# Patient Record
Sex: Female | Born: 1955 | Race: White | Hispanic: No | Marital: Married | Smoking: Never smoker
Health system: Southern US, Community
[De-identification: ages and names within clinical notes are randomized; demographics above are authoritative.]

## PROBLEM LIST (undated history)

## (undated) DIAGNOSIS — IMO0002 Reserved for concepts with insufficient information to code with codable children: Secondary | ICD-10-CM

## (undated) DIAGNOSIS — I10 Essential (primary) hypertension: Secondary | ICD-10-CM

## (undated) DIAGNOSIS — M069 Rheumatoid arthritis, unspecified: Secondary | ICD-10-CM

## (undated) DIAGNOSIS — E785 Hyperlipidemia, unspecified: Secondary | ICD-10-CM

## (undated) HISTORY — DX: Hyperlipidemia, unspecified: E78.5

## (undated) HISTORY — PX: DILATION AND CURETTAGE OF UTERUS: SHX78

## (undated) HISTORY — DX: Reserved for concepts with insufficient information to code with codable children: IMO0002

---

## 1996-12-29 HISTORY — PX: APPENDECTOMY: SHX54

## 1997-12-29 HISTORY — PX: ABDOMINAL HYSTERECTOMY: SHX81

## 2010-11-20 ENCOUNTER — Encounter: Admission: RE | Admit: 2010-11-20 | Discharge: 2010-11-20 | Payer: Self-pay

## 2011-10-21 ENCOUNTER — Other Ambulatory Visit: Payer: Self-pay | Admitting: Internal Medicine

## 2011-10-21 DIAGNOSIS — Z1231 Encounter for screening mammogram for malignant neoplasm of breast: Secondary | ICD-10-CM

## 2011-11-26 ENCOUNTER — Ambulatory Visit
Admission: RE | Admit: 2011-11-26 | Discharge: 2011-11-26 | Disposition: A | Discharge status: Home / Self Care | Payer: BC Managed Care – PPO | Source: Ambulatory Visit | Attending: Internal Medicine | Admitting: Internal Medicine

## 2011-11-26 DIAGNOSIS — Z1231 Encounter for screening mammogram for malignant neoplasm of breast: Secondary | ICD-10-CM

## 2012-01-14 LAB — HM DEXA SCAN

## 2012-05-21 ENCOUNTER — Ambulatory Visit (INDEPENDENT_AMBULATORY_CARE_PROVIDER_SITE_OTHER): Payer: BC Managed Care – PPO | Admitting: General Surgery

## 2012-05-27 ENCOUNTER — Encounter (HOSPITAL_BASED_OUTPATIENT_CLINIC_OR_DEPARTMENT_OTHER): Payer: Self-pay

## 2012-05-27 ENCOUNTER — Emergency Department (HOSPITAL_BASED_OUTPATIENT_CLINIC_OR_DEPARTMENT_OTHER): Payer: BC Managed Care – PPO

## 2012-05-27 ENCOUNTER — Observation Stay (HOSPITAL_BASED_OUTPATIENT_CLINIC_OR_DEPARTMENT_OTHER)
Admission: EM | Admit: 2012-05-27 | Discharge: 2012-05-29 | Disposition: A | Discharge status: Home / Self Care | Payer: BC Managed Care – PPO | Attending: Internal Medicine | Admitting: Internal Medicine

## 2012-05-27 DIAGNOSIS — M064 Inflammatory polyarthropathy: Secondary | ICD-10-CM | POA: Insufficient documentation

## 2012-05-27 DIAGNOSIS — R079 Chest pain, unspecified: Principal | ICD-10-CM | POA: Insufficient documentation

## 2012-05-27 DIAGNOSIS — D72820 Lymphocytosis (symptomatic): Secondary | ICD-10-CM | POA: Insufficient documentation

## 2012-05-27 DIAGNOSIS — I714 Abdominal aortic aneurysm, without rupture, unspecified: Secondary | ICD-10-CM | POA: Insufficient documentation

## 2012-05-27 DIAGNOSIS — I10 Essential (primary) hypertension: Secondary | ICD-10-CM | POA: Insufficient documentation

## 2012-05-27 DIAGNOSIS — L932 Other local lupus erythematosus: Secondary | ICD-10-CM | POA: Diagnosis present

## 2012-05-27 DIAGNOSIS — L930 Discoid lupus erythematosus: Secondary | ICD-10-CM | POA: Insufficient documentation

## 2012-05-27 DIAGNOSIS — D709 Neutropenia, unspecified: Secondary | ICD-10-CM | POA: Insufficient documentation

## 2012-05-27 HISTORY — DX: Rheumatoid arthritis, unspecified: M06.9

## 2012-05-27 HISTORY — DX: Essential (primary) hypertension: I10

## 2012-05-27 LAB — DIFFERENTIAL
Basophils Relative: 1 % (ref 0–1)
Lymphs Abs: 5.4 10*3/uL — ABNORMAL HIGH (ref 0.7–4.0)
Monocytes Absolute: 1.5 10*3/uL — ABNORMAL HIGH (ref 0.1–1.0)
Monocytes Relative: 14 % — ABNORMAL HIGH (ref 3–12)
Neutro Abs: 3 10*3/uL (ref 1.7–7.7)

## 2012-05-27 LAB — CARDIAC PANEL(CRET KIN+CKTOT+MB+TROPI)
CK, MB: 2.8 ng/mL (ref 0.3–4.0)
Total CK: 187 U/L — ABNORMAL HIGH (ref 7–177)

## 2012-05-27 LAB — COMPREHENSIVE METABOLIC PANEL
Albumin: 4 g/dL (ref 3.5–5.2)
Alkaline Phosphatase: 45 U/L (ref 39–117)
BUN: 12 mg/dL (ref 6–23)
Chloride: 104 mEq/L (ref 96–112)
Creatinine, Ser: 0.7 mg/dL (ref 0.50–1.10)
GFR calc Af Amer: >90 mL/min (ref >90)
GFR calc non Af Amer: >90 mL/min (ref >90)
Glucose, Bld: 107 mg/dL — ABNORMAL HIGH (ref 70–99)
Potassium: 3.4 mEq/L — ABNORMAL LOW (ref 3.5–5.1)
Total Bilirubin: 0.4 mg/dL (ref 0.3–1.2)

## 2012-05-27 LAB — CBC
HCT: 41.5 % (ref 36.0–46.0)
Hemoglobin: 14.3 g/dL (ref 12.0–15.0)
MCH: 31 pg (ref 26.0–34.0)
MCHC: 34.5 g/dL (ref 30.0–36.0)
RBC: 4.62 MIL/uL (ref 3.87–5.11)

## 2012-05-27 MED ORDER — ONDANSETRON HCL 4 MG/2ML IJ SOLN
4.0000 mg | Freq: Once | INTRAMUSCULAR | Status: AC
  Administered 2012-05-27: 4 mg via INTRAVENOUS
  Filled 2012-05-27: qty 2

## 2012-05-27 MED ORDER — IOHEXOL 350 MG/ML SOLN
80.0000 mL | Freq: Once | INTRAVENOUS | Status: AC | PRN
Start: 2012-05-27 — End: 2012-05-27
  Administered 2012-05-27: 80 mL via INTRAVENOUS

## 2012-05-27 MED ORDER — MORPHINE SULFATE 4 MG/ML IJ SOLN
4.0000 mg | Freq: Once | INTRAMUSCULAR | Status: AC
  Administered 2012-05-27: 4 mg via INTRAVENOUS
  Filled 2012-05-27: qty 1

## 2012-05-27 MED ORDER — ASPIRIN 81 MG PO CHEW
324.0000 mg | CHEWABLE_TABLET | Freq: Once | ORAL | Status: AC
  Administered 2012-05-27: 324 mg via ORAL
  Filled 2012-05-27: qty 4

## 2012-05-27 MED ORDER — NITROGLYCERIN 0.4 MG SL SUBL
0.4000 mg | SUBLINGUAL_TABLET | SUBLINGUAL | Status: AC | PRN
  Administered 2012-05-27 (×3): 0.4 mg via SUBLINGUAL
  Filled 2012-05-27: qty 25

## 2012-05-27 NOTE — ED Notes (Signed)
Pt reports some relief with nitro sl, vs monitored, denies sob

## 2012-05-27 NOTE — ED Provider Notes (Addendum)
History     CSN: [id number]  Arrival date & time 05/27/12  2004   First MD Initiated Contact with Patient 05/27/12 2027      Chief Complaint  Patient presents with  . Chest Pain    (Consider location/radiation/quality/duration/timing/severity/associated sxs/prior treatment) Patient is a 56 y.o. female presenting with chest pain. The history is provided by the patient.  Chest Pain The chest pain began 6 - 12 hours ago. Duration of episode(s) is 6 hours. Chest pain occurs constantly. The chest pain is unchanged. Associated with: started while sitting. At its most intense, the pain is at 9/10. The pain is currently at 9/10. The severity of the pain is severe. The quality of the pain is described as burning, pressure-like, sharp and stabbing. Radiates to: in the substernal area and goes into her right chest. Chest pain is worsened by deep breathing. Primary symptoms include cough and wheezing. Pertinent negatives for primary symptoms include no fever, no fatigue, no shortness of breath, no abdominal pain, no nausea, no vomiting and no dizziness.  Pertinent negatives for associated symptoms include no diaphoresis and no lower extremity edema. She tried nothing for the symptoms.  Her past medical history is significant for hypertension.  Pertinent negatives for past medical history include no aneurysm, no CAD, no diabetes and no MI.  Her family medical history is significant for CAD in family and early MI in family.  Procedure history is negative for cardiac catheterization and echocardiogram.     Past Medical History  Diagnosis Date  . Rheumatoid arthritis   . Hypertension     Past Surgical History  Procedure Date  . Appendectomy   . Cesarean section   . Abdominal hysterectomy     No family history on file.  History  Substance Use Topics  . Smoking status: Never Smoker   . Smokeless tobacco: Not on file  . Alcohol Use: Yes    OB History    Grav Para Term Preterm Abortions  TAB SAB Ect Mult Living                  Review of Systems  Constitutional: Negative for fever, diaphoresis and fatigue.  Respiratory: Positive for cough and wheezing. Negative for shortness of breath.   Cardiovascular: Positive for chest pain.  Gastrointestinal: Negative for nausea, vomiting and abdominal pain.  Neurological: Negative for dizziness.  All other systems reviewed and are negative.    Allergies  Review of patient's allergies indicates not on file.  Home Medications  No current outpatient prescriptions on file.  BP 207/92  Pulse 86  Temp(Src) 97.5 F (36.4 C) (Oral)  Resp 20  SpO2 98%  Physical Exam  Nursing note and vitals reviewed. Constitutional: She is oriented to person, place, and time. She appears well-developed and well-nourished. She appears distressed.       Uncomfortable in the bed and squirming around  HENT:  Head: Normocephalic and atraumatic.  Mouth/Throat: Oropharynx is clear and moist.  Eyes: Conjunctivae and EOM are normal. Pupils are equal, round, and reactive to light.  Neck: Normal range of motion. Neck supple.  Cardiovascular: Normal rate, regular rhythm and intact distal pulses.   No murmur heard. Pulmonary/Chest: Effort normal. No respiratory distress. She has wheezes. She has rhonchi in the right lower field. She has no rales. She exhibits no tenderness.  Abdominal: Soft. She exhibits no distension. There is no tenderness. There is no rebound and no guarding.  Musculoskeletal: Normal range of motion. She exhibits no  edema and no tenderness.  Neurological: She is alert and oriented to person, place, and time.  Skin: Skin is warm and dry. No rash noted. No erythema.  Psychiatric: She has a normal mood and affect. Her behavior is normal.    ED Course  Procedures (including critical care time)  Labs Reviewed  CBC - Abnormal; Notable for the following:    WBC 10.7 (*)    All other components within normal limits  DIFFERENTIAL -  Abnormal; Notable for the following:    Neutrophils Relative 28 (*)    Lymphocytes Relative 51 (*)    Lymphs Abs 5.4 (*)    Monocytes Relative 14 (*)    Monocytes Absolute 1.5 (*)    Eosinophils Relative 7 (*)    All other components within normal limits  COMPREHENSIVE METABOLIC PANEL - Abnormal; Notable for the following:    Potassium 3.4 (*)    Glucose, Bld 107 (*)    All other components within normal limits  CARDIAC PANEL(CRET KIN+CKTOT+MB+TROPI) - Abnormal; Notable for the following:    Total CK 187 (*)    All other components within normal limits  D-DIMER, QUANTITATIVE - Abnormal; Notable for the following:    D-Dimer, Quant 1.02 (*)    All other components within normal limits   Dg Chest 2 View  05/27/2012  *RADIOLOGY REPORT*  Clinical Data: Right chest pain.  CHEST - 2 VIEW  Comparison: None.  Findings: Borderline enlarged cardiac silhouette.  Minimal diffuse peribronchial thickening.  Unremarkable bones.  IMPRESSION:  1.  Borderline cardiomegaly. 2.  Minimal bronchitic changes.  Original Report Authenticated By: Darrol Angel, M.D.   Ct Angio Chest W/cm &/or Wo Cm  05/27/2012  *RADIOLOGY REPORT*  Clinical Data: Chest pain.  Hypertension.  Elevated D-dimer.  CT ANGIOGRAPHY CHEST  Technique:  Multidetector CT imaging of the chest using the standard protocol during bolus administration of intravenous contrast. Multiplanar reconstructed images including MIPs were obtained and reviewed to evaluate the vascular anatomy.  Contrast: 80mL OMNIPAQUE IOHEXOL 350 MG/ML SOLN  Comparison: None.  Findings: Technically adequate study with good opacification of the central and segmental pulmonary arteries as well as the thoracic aorta.  No focal filling defects are demonstrated in the pulmonary arteries.  No evidence of significant pulmonary embolus.  The thoracic aorta is patent without evidence of aneurysm or dissection.  Mild calcification.  Normal heart size.  No significant lymphadenopathy in  the chest. The esophagus is decompressed.  No focal airspace consolidation in the lungs.  Mild dependent atelectasis bilaterally.  No pleural effusion.  No pneumothorax. Airways appear patent.  Degenerative changes in the thoracic spine with normal alignment. Small sebaceous cyst in the posterior subcutaneous fat.  IMPRESSION: No evidence of significant pulmonary embolus.  No evidence of aortic aneurysm or dissection.  Original Report Authenticated By: Marlon Pel, M.D.   US Abdomen Complete  05/27/2012  *RADIOLOGY REPORT*  Clinical Data:  Chest pain  ABDOMINAL ULTRASOUND COMPLETE  Comparison:  CT of the chest earlier today includes part of the upper abdomen.  Findings:  Gallbladder:  No gallstones, gallbladder wall thickening, or pericholecystic fluid.  Common Bile Duct:  Within normal limits in caliber.  Liver: No focal mass lesion identified.  Within normal limits in parenchymal echogenicity.  IVC:  Limited by bowel gas.  Pancreas:  Limited evaluation by bowel gas.  No gross abnormality.  Spleen:  Within normal limits in size and echotexture.  Right kidney:  Normal in size and parenchymal echogenicity.  No evidence of mass or hydronephrosis.  Left kidney:  Normal in size and parenchymal echogenicity.  No evidence of mass or hydronephrosis.  Abdominal Aorta:  Limited by bowel gas.  One measurement suggests enlargement of to 3.1 cm.  IMPRESSION: No acute abdominal pathology.  Possible abdominal aortic aneurysm of 3.1 cm, although complete characterization is limited by overlying bowel gas.  Original Report Authenticated By: Elsie Stain, M.D.     Date: 05/27/2012  Rate: 73  Rhythm: normal sinus rhythm  QRS Axis: normal  Intervals: normal  ST/T Wave abnormalities: nonspecific ST changes ST flattening in the inferior leads  Conduction Disutrbances:none  Narrative Interpretation:   Old EKG Reviewed: none available   1. Chest pain       MDM   Patient with chest pain that's been ongoing  since 2 PM. On exam patient is uncomfortable appearing squirming around in the bed but has normal vital signs other than hypertension and denies any shortness of breath, diaphoresis, nausea or vomiting. This did not occur after eating she states she has had some issues with bronchitis that's been ongoing for the last several months after she started Remicade for her rheumatoid arthritis. She states that she finished antibiotics 2 weeks ago but has noticed an more coughing recently. On exam she has wheezing in the right lung base but no abdominal pain or palpable chest pain. She is a TIMI 0 but does have a strong family history of coronary artery disease. Concern for possible infectious etiology versus coronary artery disease versus PE. Dissection be unlikely as patient is having no abdominal pain the pain does not radiate. It all seems to be on the right side of the chest. Patient given aspirin and nitroglycerin to see if that improved her pain lower suspicion for gallbladder etiology at this time. EKG with some mild ST flattening in the inferior leads but otherwise within normal limits.  11:19 PM Labs are all within normal limits other than an elevated d-dimer. After nitroglycerin and aspirin patient's pain is not improved at all. It did improve mildly with morphine she is still uncomfortable. CT of the chest was negative for dissection or pulmonary embolism. Feel that there is still possibility that this is gallbladder and an ultrasound was ordered.  11:46 PM Ultrasound shows no sign of gallbladder pathology and the possible 3 cm aortic aneurysm however due to a large amount of bowel gas and able to fully evaluate   Patient still uncomfortable and having substernal chest pain radiating to the right hypertension cannot feel comfortable discharging the patient home as the etiology of this chest pain is still unknown. Will speak with the hospitalist about further evaluation and admission.   Gwyneth Sprout, MD 05/27/12 1610  Gwyneth Sprout, MD 05/28/12 [id number]

## 2012-05-27 NOTE — ED Notes (Signed)
Pt recently treated for bronchitis 2 weeks ago, had severe sob with bronchtis, started on remicade for RA in januarary,, pt started on pepcid several months for indigestion, pain in chest  Started this am and has not decreased in intensity

## 2012-05-27 NOTE — ED Notes (Signed)
C/o right side ,central CP since 2pm

## 2012-05-28 ENCOUNTER — Encounter (HOSPITAL_COMMUNITY): Payer: Self-pay | Admitting: General Practice

## 2012-05-28 DIAGNOSIS — I10 Essential (primary) hypertension: Secondary | ICD-10-CM

## 2012-05-28 DIAGNOSIS — D72820 Lymphocytosis (symptomatic): Secondary | ICD-10-CM | POA: Diagnosis present

## 2012-05-28 DIAGNOSIS — D702 Other drug-induced agranulocytosis: Secondary | ICD-10-CM

## 2012-05-28 DIAGNOSIS — T50905A Adverse effect of unspecified drugs, medicaments and biological substances, initial encounter: Secondary | ICD-10-CM | POA: Diagnosis present

## 2012-05-28 DIAGNOSIS — L932 Other local lupus erythematosus: Secondary | ICD-10-CM | POA: Diagnosis present

## 2012-05-28 DIAGNOSIS — J90 Pleural effusion, not elsewhere classified: Secondary | ICD-10-CM

## 2012-05-28 DIAGNOSIS — D709 Neutropenia, unspecified: Secondary | ICD-10-CM | POA: Diagnosis present

## 2012-05-28 DIAGNOSIS — R079 Chest pain, unspecified: Secondary | ICD-10-CM

## 2012-05-28 LAB — URINALYSIS, ROUTINE W REFLEX MICROSCOPIC
Bilirubin Urine: NEGATIVE
Ketones, ur: 15 mg/dL — AB
Nitrite: NEGATIVE
Specific Gravity, Urine: >1.046 — ABNORMAL HIGH (ref 1.005–1.030)
Urobilinogen, UA: 0.2 mg/dL (ref 0.0–1.0)

## 2012-05-28 LAB — CARDIAC PANEL(CRET KIN+CKTOT+MB+TROPI)
Relative Index: 1.8 (ref 0.0–2.5)
Relative Index: 2.2 (ref 0.0–2.5)
Relative Index: 2.4 (ref 0.0–2.5)
Total CK: 146 U/L (ref 7–177)
Total CK: 116 U/L (ref 7–177)
Total CK: 102 U/L (ref 7–177)
Troponin I: <0.3 ng/mL (ref <0.30)

## 2012-05-28 LAB — BASIC METABOLIC PANEL
BUN: 11 mg/dL (ref 6–23)
CO2: 26 mEq/L (ref 19–32)
Calcium: 9.4 mg/dL (ref 8.4–10.5)
Chloride: 105 mEq/L (ref 96–112)
Creatinine, Ser: 0.59 mg/dL (ref 0.50–1.10)

## 2012-05-28 LAB — CBC
HCT: 40.2 % (ref 36.0–46.0)
Hemoglobin: 13.7 g/dL (ref 12.0–15.0)
RBC: 4.38 MIL/uL (ref 3.87–5.11)
WBC: 10.3 10*3/uL (ref 4.0–10.5)

## 2012-05-28 LAB — DIFFERENTIAL
Basophils Relative: 1 % (ref 0–1)
Lymphocytes Relative: 38 % (ref 12–46)
Lymphs Abs: 3.9 10*3/uL (ref 0.7–4.0)
Monocytes Absolute: 1.4 10*3/uL — ABNORMAL HIGH (ref 0.1–1.0)
Monocytes Relative: 14 % — ABNORMAL HIGH (ref 3–12)
Neutro Abs: 4.2 10*3/uL (ref 1.7–7.7)
Neutrophils Relative %: 41 % — ABNORMAL LOW (ref 43–77)

## 2012-05-28 MED ORDER — LORAZEPAM 2 MG/ML IJ SOLN: 1.0000 mg | Freq: Four times a day (QID) | INTRAMUSCULAR | Status: DC | PRN

## 2012-05-28 MED ORDER — HYDROMORPHONE HCL PF 1 MG/ML IJ SOLN
1.0000 mg | INTRAMUSCULAR | Status: DC | PRN
  Administered 2012-05-28: 1 mg via INTRAVENOUS

## 2012-05-28 MED ORDER — ONDANSETRON HCL 4 MG/2ML IJ SOLN
4.0000 mg | Freq: Three times a day (TID) | INTRAMUSCULAR | Status: DC | PRN
  Filled 2012-05-28: qty 2

## 2012-05-28 MED ORDER — PROMETHAZINE HCL 12.5 MG PO TABS
12.5000 mg | ORAL_TABLET | Freq: Four times a day (QID) | ORAL | Status: DC | PRN
  Administered 2012-05-28: 12.5 mg via ORAL
  Filled 2012-05-28: qty 1

## 2012-05-28 MED ORDER — PREDNISONE (PAK) 5 MG PO TABS: 5.0000 mg | ORAL_TABLET | Freq: Every day | ORAL | Status: DC

## 2012-05-28 MED ORDER — PREDNISONE 5 MG PO TABS
5.0000 mg | ORAL_TABLET | Freq: Every day | ORAL | Status: DC
  Administered 2012-05-28: 5 mg via ORAL
  Filled 2012-05-28 (×4): qty 1

## 2012-05-28 MED ORDER — HYDROCODONE-ACETAMINOPHEN 5-325 MG PO TABS
1.0000 | ORAL_TABLET | ORAL | Status: DC | PRN
  Administered 2012-05-28 – 2012-05-29 (×3): 1 via ORAL
  Filled 2012-05-28 (×4): qty 1

## 2012-05-28 MED ORDER — SODIUM CHLORIDE 0.9 % IV SOLN: 250.0000 mL | INTRAVENOUS | Status: DC | PRN

## 2012-05-28 MED ORDER — SENNA 8.6 MG PO TABS
1.0000 | ORAL_TABLET | Freq: Two times a day (BID) | ORAL | Status: DC
  Administered 2012-05-29: 8.6 mg via ORAL
  Filled 2012-05-28 (×4): qty 1

## 2012-05-28 MED ORDER — SODIUM CHLORIDE 0.9 % IJ SOLN: 3.0000 mL | INTRAMUSCULAR | Status: DC | PRN

## 2012-05-28 MED ORDER — SODIUM CHLORIDE 0.9 % IJ SOLN
3.0000 mL | Freq: Two times a day (BID) | INTRAMUSCULAR | Status: DC
  Administered 2012-05-28 (×2): 3 mL via INTRAVENOUS

## 2012-05-28 MED ORDER — ENOXAPARIN SODIUM 40 MG/0.4ML ~~LOC~~ SOLN
40.0000 mg | SUBCUTANEOUS | Status: DC
  Administered 2012-05-28 – 2012-05-29 (×2): 40 mg via SUBCUTANEOUS
  Filled 2012-05-28 (×2): qty 0.4

## 2012-05-28 MED ORDER — SODIUM CHLORIDE 0.9 % IV SOLN: INTRAVENOUS | Status: DC

## 2012-05-28 MED ORDER — LISINOPRIL 20 MG PO TABS
20.0000 mg | ORAL_TABLET | Freq: Every day | ORAL | Status: DC
  Administered 2012-05-28 – 2012-05-29 (×2): 20 mg via ORAL
  Filled 2012-05-28 (×2): qty 1

## 2012-05-28 MED ORDER — LISINOPRIL-HYDROCHLOROTHIAZIDE 20-12.5 MG PO TABS: 1.0000 | ORAL_TABLET | Freq: Every day | ORAL | Status: DC

## 2012-05-28 MED ORDER — ACETAMINOPHEN 325 MG PO TABS
650.0000 mg | ORAL_TABLET | Freq: Four times a day (QID) | ORAL | Status: DC | PRN
  Administered 2012-05-28: 650 mg via ORAL
  Filled 2012-05-28: qty 2

## 2012-05-28 MED ORDER — HYDROMORPHONE HCL PF 1 MG/ML IJ SOLN
1.0000 mg | Freq: Once | INTRAMUSCULAR | Status: AC
  Administered 2012-05-28: 1 mg via INTRAVENOUS
  Filled 2012-05-28: qty 1

## 2012-05-28 MED ORDER — PROMETHAZINE HCL 25 MG/ML IJ SOLN: 25.0000 mg | Freq: Four times a day (QID) | INTRAMUSCULAR | Status: DC | PRN

## 2012-05-28 MED ORDER — PROMETHAZINE HCL 25 MG/ML IJ SOLN
12.5000 mg | Freq: Four times a day (QID) | INTRAMUSCULAR | Status: DC | PRN
  Administered 2012-05-28: 12.5 mg via INTRAVENOUS
  Filled 2012-05-28: qty 1

## 2012-05-28 MED ORDER — POLYETHYLENE GLYCOL 3350 17 G PO PACK
17.0000 g | PACK | Freq: Every day | ORAL | Status: DC | PRN
  Filled 2012-05-28: qty 1

## 2012-05-28 MED ORDER — ONDANSETRON HCL 4 MG/2ML IJ SOLN
4.0000 mg | INTRAMUSCULAR | Status: DC | PRN
  Administered 2012-05-28 (×3): 4 mg via INTRAVENOUS
  Filled 2012-05-28 (×2): qty 2

## 2012-05-28 MED ORDER — LEFLUNOMIDE 20 MG PO TABS
20.0000 mg | ORAL_TABLET | Freq: Every day | ORAL | Status: DC
  Administered 2012-05-28 – 2012-05-29 (×2): 20 mg via ORAL
  Filled 2012-05-28 (×2): qty 1

## 2012-05-28 MED ORDER — IBUPROFEN 800 MG PO TABS
800.0000 mg | ORAL_TABLET | Freq: Three times a day (TID) | ORAL | Status: DC
  Administered 2012-05-28 – 2012-05-29 (×2): 800 mg via ORAL
  Filled 2012-05-28 (×6): qty 1

## 2012-05-28 MED ORDER — HYDROCHLOROTHIAZIDE 12.5 MG PO CAPS
12.5000 mg | ORAL_CAPSULE | Freq: Every day | ORAL | Status: DC
  Administered 2012-05-28 – 2012-05-29 (×2): 12.5 mg via ORAL
  Filled 2012-05-28 (×2): qty 1

## 2012-05-28 MED ORDER — DOCUSATE SODIUM 100 MG PO CAPS
100.0000 mg | ORAL_CAPSULE | Freq: Two times a day (BID) | ORAL | Status: DC
  Administered 2012-05-29: 100 mg via ORAL
  Filled 2012-05-28 (×4): qty 1

## 2012-05-28 MED ORDER — ACETAMINOPHEN 650 MG RE SUPP: 650.0000 mg | Freq: Four times a day (QID) | RECTAL | Status: DC | PRN

## 2012-05-28 MED ORDER — HYDROMORPHONE HCL PF 1 MG/ML IJ SOLN
1.0000 mg | INTRAMUSCULAR | Status: DC | PRN
  Administered 2012-05-28: 1 mg via INTRAVENOUS
  Filled 2012-05-28 (×2): qty 1

## 2012-05-28 MED ORDER — ASPIRIN EC 325 MG PO TBEC
325.0000 mg | DELAYED_RELEASE_TABLET | Freq: Every day | ORAL | Status: DC
  Administered 2012-05-28 – 2012-05-29 (×2): 325 mg via ORAL
  Filled 2012-05-28 (×2): qty 1

## 2012-05-28 NOTE — H&P (Signed)
PCP:  Thayer Headings, MD, MD   Confirmed Dr. Dierdre Forth, rheum  Chief Complaint:  Chest pain  HPI: 55yoF with h/o RA x30 yrs and HTN who presents with right sided chest pain. Transfer from  [facility].   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 She states yesterday around 2p she developed right sided, sharp, stabbing, squeezing  chest pain, radiating around deep into her right chest, with no associated dizziness,  lightheadedness, nausea, possibly minimal clamminess. It is not worse with exertion or  relieved with rest. She has been trying to move around to get comfortable but cannot. It  is possibly a bit worse when she breathes in very deeply. She went home early, symtpoms  didn't get better, so husband took to ED at [facility].   Vitals so far stable except HTN up to 207/92. Labs with normal chem, LFT's. WBC 10.7 with  lymphocytosis. Cardiac enzymes negative x1. Ddimer minimally eleavated 1.02. CXR with  borderline cardiomegaly and minimal bronchitic changes. CTA chest done showed nothing. No  ECG is apparently in EPIC or in the notes sent, but per notes show some ST flattening  inferior leads otherwise normal. There was concern for GB etiology so abdominal u/s done,  showed nothing acute but ? possible AAA of 3.1 cm, but not completely characterized. She  was still uncomfortable, and no clear etiology found, so admission/transfer requested. Pt  had been given 324 ASA, 1mg  dilaudid, 4mg  morphine x2, SL NTG x3, zofran x1.   On interview, they state that things have gone badly since she started Remicade in  01/2012. Not long after, she got fevers (last one a month ago), cough, and was diagnosed  with bronchitis, and has felt poorly, malaised since. She wanted to stop but was  encouraged to keep going, and in fact they doubled her dose on last infustion. Husband  has the drug insert paperwork with him and asks about "lupus like syndrome."    ROS as above, otherwise pt is now nauseous and vomiting, but no abd pain, no dairrhea, no  dysuria. No left chest pain, dyspnea. ROS otherwise negative.    Past Medical History  Diagnosis Date  . Rheumatoid arthritis   . Hypertension     Past Surgical History  Procedure Date  . Appendectomy   . Cesarean section   . Abdominal hysterectomy     Medications:  HOME MEDS: Reconciled with pt  Prior to Admission medications   Medication Sig Start Date End Date Taking? Authorizing Provider  cholecalciferol (VITAMIN D) 1000 UNITS tablet Take 1,000 Units by mouth daily.   Yes Historical Provider, MD  HYDROcodone-acetaminophen (VICODIN) 5-500 MG per tablet Take 1 tablet by mouth every 6 (six) hours as needed.   Yes Historical Provider, MD  leflunomide (ARAVA) 20 MG tablet Take 20 mg by mouth daily.   Yes Historical Provider, MD  lisinopril-hydrochlorothiazide (PRINZIDE,ZESTORETIC) 20-12.5 MG per tablet Take 1 tablet by mouth daily.   Yes Historical Provider, MD  Multiple Vitamin (MULTIVITAMIN) tablet Take 1 tablet by mouth daily.   Yes Historical Provider, MD  predniSONE (STERAPRED UNI-PAK) 5 MG TABS Take 5 mg by mouth daily.   Yes Historical Provider, MD    Allergies:  Allergies  Allergen Reactions  . Tape Other (See Comments)    Patient is allergic to surgical tape.  It causes blisters.  . Tequin (Gatifloxacin) Other (See Comments)    Causes leg cramps.    Social  History:   reports that she has never smoked. She does not have any smokeless tobacco history on file. She reports that she does not drink alcohol or use illicit drugs.  Pt is still active working as a Lawyer. Her and her husband live in [place] and go to [place] where they also have 14 acres, and they do all the clearing of the land, etc. She is a never smoker, no drugs, no alcohol. Has 2 daughters.   Family History: History reviewed. No pertinent family history.  Physical Exam: Filed Vitals:   05/28/12 0215 05/28/12  0230 05/28/12 0300 05/28/12 0330  BP: 167/86 167/82 191/78 137/73  Pulse: 57 73 63 67  Temp:      TempSrc:      Resp:      Height:      Weight:      SpO2: 99% 99% 99% 91%   Blood pressure 137/73, pulse 67, temperature 97.3 F (36.3 C), temperature source Oral, resp. rate 15, height 5\' 7"  (1.702 m), weight 102.7 kg (226 lb 6.6 oz), SpO2 91.00%.  Gen: Middle aged, overall healthy appearing F who appears quite uncomfortable, she is  squirming around the bed, endorses discomfort, and after I leave the room starts  vomiting. She is calm and stable while I talk to her though, pleasant, a good historian.  HEENT: Pupils round, reactive, equal, sclera/irises normal and clear. EOMI,no nystagmus,  mouth moist and normal appearing overall Lungs: Bibasilar light inspiratory crackles, that clear just superior, no other w/c/r,  good air movement, no cough, no increased WOB or accessory muscles Heart: Regular, S1/2 heard, no m/g heard, normal overall, regular, not tachy. There is no  rash or obvious abnormality of her right chest. She cringes and endorses pain with  palpation of her right sternal border.  Abd: Soft, not tender, not distnede, no grimacing at all, quite benign exam, not  distneded Extrem: Warm, perfusing well, radials palpable, no frank RA changes or Z-line  deformities, joints not that impressive, no edema, erythema, or swelling. no BLE edema  Neuro: Alert, attentive, conversant, CN 2-12 intact, speech clear and fluent, moves  around in bed and moves extremities on her own, grossly non-focal.    Labs & Imaging Results for orders placed during the hospital encounter of 05/27/12 (from the past 48 hour(s))  CBC     Status: Abnormal   Collection Time   05/27/12  8:36 PM      Component Value Range Comment   WBC 10.7 (*) 4.0 - 10.5 (K/uL)    RBC 4.62  3.87 - 5.11 (MIL/uL)    Hemoglobin 14.3  12.0 - 15.0 (g/dL)    HCT 16.1  09.6 - 04.5 (%)    MCV 89.8  78.0 - 100.0 (fL)    MCH 31.0   26.0 - 34.0 (pg)    MCHC 34.5  30.0 - 36.0 (g/dL)    RDW 40.9  81.1 - 91.4 (%)    Platelets 219  150 - 400 (K/uL) LARGE PLATELETS PRESENT  DIFFERENTIAL     Status: Abnormal   Collection Time   05/27/12  8:36 PM      Component Value Range Comment   Neutrophils Relative 28 (*) 43 - 77 (%)    Neutro Abs 3.0  1.7 - 7.7 (K/uL)    Lymphocytes Relative 51 (*) 12 - 46 (%)    Lymphs Abs 5.4 (*) 0.7 - 4.0 (K/uL)    Monocytes Relative 14 (*) 3 - 12 (%)  Monocytes Absolute 1.5 (*) 0.1 - 1.0 (K/uL)    Eosinophils Relative 7 (*) 0 - 5 (%)    Eosinophils Absolute 0.7  0.0 - 0.7 (K/uL)    Basophils Relative 1  0 - 1 (%)    Basophils Absolute 0.1  0.0 - 0.1 (K/uL)   COMPREHENSIVE METABOLIC PANEL     Status: Abnormal   Collection Time   05/27/12  8:36 PM      Component Value Range Comment   Sodium 141  135 - 145 (mEq/L)    Potassium 3.4 (*) 3.5 - 5.1 (mEq/L)    Chloride 104  96 - 112 (mEq/L)    CO2 28  19 - 32 (mEq/L)    Glucose, Bld 107 (*) 70 - 99 (mg/dL)    BUN 12  6 - 23 (mg/dL)    Creatinine, Ser 1.61  0.50 - 1.10 (mg/dL)    Calcium 9.9  8.4 - 10.5 (mg/dL)    Total Protein 7.1  6.0 - 8.3 (g/dL)    Albumin 4.0  3.5 - 5.2 (g/dL)    AST 34  0 - 37 (U/L)    ALT 26  0 - 35 (U/L)    Alkaline Phosphatase 45  39 - 117 (U/L)    Total Bilirubin 0.4  0.3 - 1.2 (mg/dL)    GFR calc non Af Amer >90  >90 (mL/min)    GFR calc Af Amer >90  >90 (mL/min)   CARDIAC PANEL(CRET KIN+CKTOT+MB+TROPI)     Status: Abnormal   Collection Time   05/27/12  8:36 PM      Component Value Range Comment   Total CK 187 (*) 7 - 177 (U/L)    CK, MB 2.8  0.3 - 4.0 (ng/mL)    Troponin I <0.30  <0.30 (ng/mL)    Relative Index 1.5  0.0 - 2.5    D-DIMER, QUANTITATIVE     Status: Abnormal   Collection Time   05/27/12  9:05 PM      Component Value Range Comment   D-Dimer, Quant 1.02 (*) 0.00 - 0.48 (ug/mL-FEU)    Dg Chest 2 View  05/27/2012  *RADIOLOGY REPORT*  Clinical Data: Right chest pain.  CHEST - 2 VIEW  Comparison:  None.  Findings: Borderline enlarged cardiac silhouette.  Minimal diffuse peribronchial thickening.  Unremarkable bones.  IMPRESSION:  1.  Borderline cardiomegaly. 2.  Minimal bronchitic changes.  Original Report Authenticated By: Darrol Angel, M.D.   Ct Angio Chest W/cm &/or Wo Cm  05/27/2012  *RADIOLOGY REPORT*  Clinical Data: Chest pain.  Hypertension.  Elevated D-dimer.  CT ANGIOGRAPHY CHEST  Technique:  Multidetector CT imaging of the chest using the standard protocol during bolus administration of intravenous contrast. Multiplanar reconstructed images including MIPs were obtained and reviewed to evaluate the vascular anatomy.  Contrast: 80mL OMNIPAQUE IOHEXOL 350 MG/ML SOLN  Comparison: None.  Findings: Technically adequate study with good opacification of the central and segmental pulmonary arteries as well as the thoracic aorta.  No focal filling defects are demonstrated in the pulmonary arteries.  No evidence of significant pulmonary embolus.  The thoracic aorta is patent without evidence of aneurysm or dissection.  Mild calcification.  Normal heart size.  No significant lymphadenopathy in the chest. The esophagus is decompressed.  No focal airspace consolidation in the lungs.  Mild dependent atelectasis bilaterally.  No pleural effusion.  No pneumothorax. Airways appear patent.  Degenerative changes in the thoracic spine with normal alignment. Small sebaceous cyst in the  posterior subcutaneous fat.  IMPRESSION: No evidence of significant pulmonary embolus.  No evidence of aortic aneurysm or dissection.  Original Report Authenticated By: Marlon Pel, M.D.   US Abdomen Complete  05/27/2012  *RADIOLOGY REPORT*  Clinical Data:  Chest pain  ABDOMINAL ULTRASOUND COMPLETE  Comparison:  CT of the chest earlier today includes part of the upper abdomen.  Findings:  Gallbladder:  No gallstones, gallbladder wall thickening, or pericholecystic fluid.  Common Bile Duct:  Within normal limits in caliber.   Liver: No focal mass lesion identified.  Within normal limits in parenchymal echogenicity.  IVC:  Limited by bowel gas.  Pancreas:  Limited evaluation by bowel gas.  No gross abnormality.  Spleen:  Within normal limits in size and echotexture.  Right kidney:  Normal in size and parenchymal echogenicity.  No evidence of mass or hydronephrosis.  Left kidney:  Normal in size and parenchymal echogenicity.  No evidence of mass or hydronephrosis.  Abdominal Aorta:  Limited by bowel gas.  One measurement suggests enlargement of to 3.1 cm.  IMPRESSION: No acute abdominal pathology.  Possible abdominal aortic aneurysm of 3.1 cm, although complete characterization is limited by overlying bowel gas.  Original Report Authenticated By: Elsie Stain, M.D.    ECG: NSR normal axis, 69 bpm, normal P and PR, narrow QRS without Q waves, no ST  deviations, normal T waves. OVerall normal ECG.    Impression Present on Admission:  .Drug-induced lupus erythematosus .Chest pain .Neutropenia .Lymphocytosis  55yoF with h/o RA x30 yrs and HTN who presents with right sided chest pain. Transfer from  [facility].   1. Chest pain: I think this is probably a pleuritis or costochondritis. The location of  her pain is atypical for cardiac, she has no CAD risk factors, and w/u so far is  negative. CTA has ruled out other worrisome pulmonary etiologies. There is some worsening  with deep inspiration, and she really cringes when I press on her right chest.   As the husband mentions, she "went downhill" after starting Remicade in 01/2012 (with  fevers, bronchitis, respiratory issues, all of which very common adverse rxns to  Remicade), and ~50% of pts develop ANA and have lupus like syndromes, so lupus-like  pleurisy is quite concerning, so we'll test for this. Complicating this is that we have  no records to see if her baseline ANA is negative. Per UTD: "most cases of drug induced  lupus associated with TNF inhibitors resolve  completely upon discontinuation."   Of note, she also has an abnormal differential, which Remicade can also produce, see  below.   - Start high dose ibuprofen. Rule out MI with enzymes, trend ECG but I think these will  be negative.  - As already discussed with pt, will leave to pt to discuss with rheumatologist whether  to continue remicade - UA in consideration of nephrolithiasis.  - ANA, ESR, CRP, dsDNA, anti Smith, anti-histone Ab's  - Consider Rheum consultation, or talking to Dr. Dierdre Forth.   2. Abnormal differential: Prominent lymphocytosis, neutropenia, with overall minimal  leukocytosis. As above, Remicade can produce hematologic toxicities. We have no baseline  differential.  - Peripheral smear and consider Heme consultation  3. RA: continue leflunomide, prednisone  4. HTN: Continue home zestoretic  DVT prophy: lovenox SDU, [facility] team 5 -- transferred to SDU, can likely call out soon Presumed full code   Other plans as per orders.   Mukund Weinreb 05/28/2012, 3:43 AM

## 2012-05-28 NOTE — Progress Notes (Signed)
Spoke with MD and informed him of low grade temp and pt c/o arthritic pain, orders given for home pain regime, will continue to monitor temp, Berle Mull RN

## 2012-05-28 NOTE — Progress Notes (Signed)
8:42 AM I agree with HPI/GPe and A/P per Dr. Angus Palms  Has some Vomiting-states is very sensitive to medicine and has received dilaudid for CP which relieves the pain but might have caused this.  HEr CP is ill defined but much better.  SHe vomtied x 1 in room.  No sob,fever, chills, diarrhoea,dysuria, syncope      HEENT alert pleasant no pallor CHEST cta b, no added sound CARDIAC s1 s2 no m/r/g ABDOMEN soft nt/nd NEURO grossly intact SKIN/MUSCULAR no swelling, no lymphad  Patient Active Problem List  Diagnoses  . Drug-induced lupus erythematosus-unlikely- spoke with Dr. Dierdre Forth who conforms her Anti-CCP was + indicating more of a RA picture.  He goes onto mention one would usually find fever and rash with a  Lupus like flare and state swe can hold off on the Remicade and she can follow with him as an out-patient.  Will await resolution of the pain and N and re-eval in am  Continue Avara nd prednisone  . Chest pain-unlikely cardiogenic.  Await further CE' sand reassess  . Neutropenia-likely 2/2 to Remicaide vs RA  . Lymphocytosis resolved.   Likely can d/c in am if n/v resolved  Pleas Koch, MD [facility] [phone]

## 2012-05-29 DIAGNOSIS — D702 Other drug-induced agranulocytosis: Secondary | ICD-10-CM

## 2012-05-29 DIAGNOSIS — I10 Essential (primary) hypertension: Secondary | ICD-10-CM

## 2012-05-29 DIAGNOSIS — R079 Chest pain, unspecified: Secondary | ICD-10-CM

## 2012-05-29 DIAGNOSIS — J90 Pleural effusion, not elsewhere classified: Secondary | ICD-10-CM

## 2012-05-29 LAB — GLUCOSE, CAPILLARY: Glucose-Capillary: 91 mg/dL (ref 70–99)

## 2012-05-29 MED ORDER — PREDNISONE 5 MG PO TABS: 5.0000 mg | ORAL_TABLET | Freq: Every day | ORAL | Status: DC

## 2012-05-29 MED ORDER — PREDNISONE 5 MG PO TABS
5.0000 mg | ORAL_TABLET | Freq: Every day | ORAL | Status: DC
  Filled 2012-05-29: qty 1

## 2012-05-29 MED ORDER — HYDROCODONE-ACETAMINOPHEN 5-500 MG PO TABS: 1.0000 | ORAL_TABLET | Freq: Four times a day (QID) | ORAL | Status: DC | PRN

## 2012-05-29 MED ORDER — DSS 100 MG PO CAPS: 100.0000 mg | ORAL_CAPSULE | Freq: Two times a day (BID) | ORAL | Status: AC

## 2012-05-29 MED ORDER — METHYLPREDNISOLONE SODIUM SUCC 40 MG IJ SOLR
40.0000 mg | Freq: Once | INTRAMUSCULAR | Status: AC
  Administered 2012-05-29: 40 mg via INTRAVENOUS
  Filled 2012-05-29: qty 1

## 2012-05-29 MED ORDER — HYDROCODONE-ACETAMINOPHEN 5-325 MG PO TABS: 1.0000 | ORAL_TABLET | ORAL | Status: DC | PRN

## 2012-05-29 NOTE — Progress Notes (Signed)
Pt to be d/c'd home with family. IV d/c'd and d/c instructions given.

## 2012-05-29 NOTE — Progress Notes (Signed)
  Echocardiogram 2D Echocardiogram has been performed.  Afia Messenger L 05/29/2012, 11:17 AM

## 2012-05-29 NOTE — Progress Notes (Signed)
Subjective: Patient continues to complain of left-sided chest pain.  Objective: Weight change: -2.682 kg (-5 lb 14.6 oz)  Intake/Output Summary (Last 24 hours) at 05/29/12 1411 Last data filed at 05/29/12 1000  Gross per 24 hour  Intake   1380 ml  Output   1350 ml  Net     30 ml    Filed Vitals:   05/29/12 1200  BP: 149/78  Pulse:   Temp:   Resp: 18   General: Patient appears her stated age. Cardiovascular: Regular rate rhythm no murmurs rubs or gallops. Lungs: Clear to auscultation bilaterally. Abdomen: Soft nontender nondistended positive bowel sounds. Extremities: No edema  Lab Results: Reviewed  Micro Results: Recent Results (from the past 240 hour(s))  MRSA PCR SCREENING     Status: Normal   Collection Time   05/28/12  1:56 AM      Component Value Range Status Comment   MRSA by PCR NEGATIVE  NEGATIVE  Final     Studies/Results: Dg Chest 2 View  05/27/2012  *RADIOLOGY REPORT*  Clinical Data: Right chest pain.  CHEST - 2 VIEW  Comparison: None.  Findings: Borderline enlarged cardiac silhouette.  Minimal diffuse peribronchial thickening.  Unremarkable bones.  IMPRESSION:  1.  Borderline cardiomegaly. 2.  Minimal bronchitic changes.  Original Report Authenticated By: Darrol Angel, M.D.   Ct Angio Chest W/cm &/or Wo Cm  05/27/2012  *RADIOLOGY REPORT*  Clinical Data: Chest pain.  Hypertension.  Elevated D-dimer.  CT ANGIOGRAPHY CHEST  Technique:  Multidetector CT imaging of the chest using the standard protocol during bolus administration of intravenous contrast. Multiplanar reconstructed images including MIPs were obtained and reviewed to evaluate the vascular anatomy.  Contrast: 80mL OMNIPAQUE IOHEXOL 350 MG/ML SOLN  Comparison: None.  Findings: Technically adequate study with good opacification of the central and segmental pulmonary arteries as well as the thoracic aorta.  No focal filling defects are demonstrated in the pulmonary arteries.  No evidence of significant  pulmonary embolus.  The thoracic aorta is patent without evidence of aneurysm or dissection.  Mild calcification.  Normal heart size.  No significant lymphadenopathy in the chest. The esophagus is decompressed.  No focal airspace consolidation in the lungs.  Mild dependent atelectasis bilaterally.  No pleural effusion.  No pneumothorax. Airways appear patent.  Degenerative changes in the thoracic spine with normal alignment. Small sebaceous cyst in the posterior subcutaneous fat.  IMPRESSION: No evidence of significant pulmonary embolus.  No evidence of aortic aneurysm or dissection.  Original Report Authenticated By: Marlon Pel, M.D.   US Abdomen Complete  05/27/2012  *RADIOLOGY REPORT*  Clinical Data:  Chest pain  ABDOMINAL ULTRASOUND COMPLETE  Comparison:  CT of the chest earlier today includes part of the upper abdomen.  Findings:  Gallbladder:  No gallstones, gallbladder wall thickening, or pericholecystic fluid.  Common Bile Duct:  Within normal limits in caliber.  Liver: No focal mass lesion identified.  Within normal limits in parenchymal echogenicity.  IVC:  Limited by bowel gas.  Pancreas:  Limited evaluation by bowel gas.  No gross abnormality.  Spleen:  Within normal limits in size and echotexture.  Right kidney:  Normal in size and parenchymal echogenicity.  No evidence of mass or hydronephrosis.  Left kidney:  Normal in size and parenchymal echogenicity.  No evidence of mass or hydronephrosis.  Abdominal Aorta:  Limited by bowel gas.  One measurement suggests enlargement of to 3.1 cm.  IMPRESSION: No acute abdominal pathology.  Possible abdominal aortic aneurysm  of 3.1 cm, although complete characterization is limited by overlying bowel gas.  Original Report Authenticated By: Elsie Stain, M.D.   Medications: Scheduled Meds:   . aspirin EC  325 mg Oral Daily  . docusate sodium  100 mg Oral BID  . enoxaparin  40 mg Subcutaneous Q24H  . lisinopril  20 mg Oral Daily   And  .  hydrochlorothiazide  12.5 mg Oral Daily  . ibuprofen  800 mg Oral TID  . leflunomide  20 mg Oral Daily  . methylPREDNISolone (SOLU-MEDROL) injection  40 mg Intravenous Once  . predniSONE  5 mg Oral Q breakfast  . senna  1 tablet Oral BID  . sodium chloride  3 mL Intravenous Q12H  . DISCONTD: predniSONE  5 mg Oral Q breakfast   Continuous Infusions:  PRN Meds:.sodium chloride, acetaminophen, acetaminophen, HYDROcodone-acetaminophen, LORazepam, ondansetron (ZOFRAN) IV, polyethylene glycol, promethazine, promethazine, sodium chloride, DISCONTD: HYDROcodone-acetaminophen  Assessment/Plan: Drug-induced lupus erythematosus (05/28/2012) Patient probably has drug-induced lupus. Will continue supportive therapy. Patient to followup with rheumatology when she is discharged from the hospital.    Chest pain (05/28/2012) Patient continues to complain of chest pain. So far workup negative with CT of the chest. 2-D echo ordered result is still pending. If 2-D echo is normal then will discharge patient on anti-inflammatory to followup with primary care physician.    Neutropenia (05/28/2012) Related to Remicade will continue to monitor her with CBC.    Lymphocytosis (05/28/2012)  related to steroids. That can be followed up outpatient as well.  Disposition: Will discharge home to follow up with PCP and rheumatology if 2-D echo normal.   LOS: 2 days   Karla Hawkins 05/29/2012, 2:11 PM Pager [phone]

## 2012-05-29 NOTE — Discharge Summary (Signed)
DISCHARGE SUMMARY  Karla Hawkins  MR#: [id number]  DOB:18-Aug-1956  Date of Admission: 05/27/2012 Date of Discharge: 05/29/2012  Attending Physician:Yoshika Vensel JARRETT  Patient's NWG:[id number],VHQIO, MD, MD  Consults: -None  Discharge Diagnoses: .Drug-induced lupus erythematosus .Chest pain .Neutropenia .Lymphocytosis .Rheumatoid aortitis .HTN (hypertension) Abdominal aortic aneurysm measuring 3.1 cm   Initial presentation: is reliable historian, states she has RA x30 yrs but still very functional, working as  Lawyer. She states yesterday around 2p she developed right sided, sharp, stabbing, squeezing  chest pain, radiating around deep into her right chest, with no associated dizziness,  lightheadedness, nausea, possibly minimal clamminess. It is not worse with exertion or  relieved with rest. She has been trying to move around to get comfortable but cannot. It  is possibly a bit worse when she breathes in very deeply. She went home early, symtpoms  didn't get better, so husband took to ED at [facility].  Vitals so far stable except HTN up to 207/92. Labs with normal chem, LFT's. WBC 10.7 with  lymphocytosis. Cardiac enzymes negative x1. Ddimer minimally eleavated 1.02. CXR with  borderline cardiomegaly and minimal bronchitic changes. CTA chest done showed nothing. No  ECG is apparently in EPIC or in the notes sent, but per notes show some ST flattening  inferior leads otherwise normal. There was concern for GB etiology so abdominal u/s done,  showed nothing acute but ? possible AAA of 3.1 cm, but not completely characterized. She  was still uncomfortable, and no clear etiology found, so admission/transfer requested. Pt  had been given 324 ASA, 1mg  dilaudid, 4mg  morphine x2, SL NTG x3, zofran x1.  On interview, they state that things have gone badly since she started Remicade in  01/2012. Not long after, she got fevers (last one a month ago), cough, and was diagnosed  with bronchitis,  and has felt poorly, malaised since. She wanted to stop but was  encouraged to keep going, and in fact they doubled her dose on last infustion. Husband  has the drug insert paperwork with him and asks about "lupus like syndrome."    Hospital Course: .Drug-induced lupus erythematosus Patient probably has drug-induced lupus. Rheumatologic workup still pending. Patient will follow up outpatient with her rheumatologist. Most likely Remicade will be discontinued. Patient states that her next treatment will be 6-7 weeks from today.  .Chest pain Patient's chest pain workup has been negative. CT  angio chest did not show a PE, 2-D echo did not show any fluid to indicate pericarditis. Patient states that with the IV steroids chest pain has greatly improved. She will followup outpatient next week with her primary care physician regarding this.  She will take her medications Narco when necessary for pain. She was also given prescription for steroid taper then resume her 5 mg daily.  .Neutropenia/.Lymphocytosis Most likely related to the Remicade. That can be monitored outpatient by her Rheumatologist.   .Rheumatoid artitis Followup outpatient with Rheumatology   .HTN (hypertension) Continue lisinopril HCTZ  AAA Discussed small aneurysm with patient. She will followup outpatient with her primary care physician regarding this. Most likely she will just need routine six-month followups. Will defer to primary care physician.  Medication List  As of 05/29/2012  4:05 PM   TAKE these medications         cholecalciferol 1000 UNITS tablet   Commonly known as: VITAMIN D   Take 1,000 Units by mouth daily.      DSS 100 MG Caps   Take 100 mg by mouth  2 (two) times daily.      HYDROcodone-acetaminophen 5-500 MG per tablet   Commonly known as: VICODIN   Take 1 tablet by mouth every 6 (six) hours as needed.      leflunomide 20 MG tablet   Commonly known as: ARAVA   Take 20 mg by mouth daily.       lisinopril-hydrochlorothiazide 20-12.5 MG per tablet   Commonly known as: PRINZIDE,ZESTORETIC   Take 1 tablet by mouth daily.      multivitamin tablet   Take 1 tablet by mouth daily.      predniSONE 5 MG tablet   Commonly known as: DELTASONE   Take 1 tablet (5 mg total) by mouth daily.             Day of Discharge BP 149/78  Pulse 70  Temp(Src) 97.7 F (36.5 C) (Oral)  Resp 18  Ht 5\' 7"  (1.702 m)  Wt 100.018 kg (220 lb 8 oz)  BMI 34.54 kg/m2  SpO2 95%  Physical Exam: General: Patient appears her stated age.  Cardiovascular: Regular rate rhythm no murmurs rubs or gallops.  Lungs: Clear to auscultation bilaterally.  Abdomen: Soft nontender nondistended positive bowel sounds.  Extremities: No edema   Results for orders placed during the hospital encounter of 05/27/12 (from the past 24 hour(s))  CARDIAC PANEL(CRET KIN+CKTOT+MB+TROPI)     Status: Normal   Collection Time   05/28/12  8:31 PM      Component Value Range   Total CK 102  7 - 177 (U/L)   CK, MB 2.4  0.3 - 4.0 (ng/mL)   Troponin I <0.30  <0.30 (ng/mL)   Relative Index 2.4  0.0 - 2.5   GLUCOSE, CAPILLARY     Status: Normal   Collection Time   05/29/12  8:04 AM      Component Value Range   Glucose-Capillary 91  70 - 99 (mg/dL)    Disposition: Home  Follow-up Appts: Discharge Orders    Future Appointments: Provider: Department: Dept Phone: Center:   06/10/2012 9:00 AM Ernestene Mention, MD [facility] [phone] None      Follow-up Information    Follow up with [facility], MD in 1 week.   Contact information:   [address], [address] [facility] [place] [address] [phone]       Follow up with Donnetta Hail, MD in 1 week.   Contact information:   [address], [address] [facility] [place] [address] [phone]           Time spent in discharge (includes decision making & examination of  pt): 45 minutes  Signed: Syanna Remmert JARRETT 05/29/2012, 4:05 PM

## 2012-05-31 LAB — ANTI-DNA ANTIBODY, DOUBLE-STRANDED: ds DNA Ab: 2 IU/mL (ref <30)

## 2012-05-31 LAB — ANTI-SMITH ANTIBODY: ENA SM Ab Ser-aCnc: <1 AU/mL (ref <30)

## 2012-05-31 LAB — ANA: Anti Nuclear Antibody(ANA): NEGATIVE

## 2012-06-10 ENCOUNTER — Encounter (INDEPENDENT_AMBULATORY_CARE_PROVIDER_SITE_OTHER): Payer: Self-pay | Admitting: General Surgery

## 2012-06-10 ENCOUNTER — Ambulatory Visit (INDEPENDENT_AMBULATORY_CARE_PROVIDER_SITE_OTHER): Payer: BC Managed Care – PPO | Admitting: General Surgery

## 2012-06-10 VITALS — BP 132/78 | HR 72 | Temp 98.3°F | Resp 14 | Ht 67.0 in | Wt 224.5 lb

## 2012-06-10 DIAGNOSIS — R229 Localized swelling, mass and lump, unspecified: Secondary | ICD-10-CM

## 2012-06-10 DIAGNOSIS — R222 Localized swelling, mass and lump, trunk: Secondary | ICD-10-CM

## 2012-06-10 NOTE — Progress Notes (Signed)
Patient ID: Karla Hawkins, female   DOB: [birth year], 56 y.o.   MRN: [id number]  Chief Complaint  Patient presents with  . Cyst    On back     HPI Karla Hawkins is a 56 y.o. female.  She is referred by Dr. Thayer Headings at [facility] for evaluation and management of a mass on her mid back.. She is frustrated and has decided to have a this managed definitively at this time.  This has been present for at least 5 years. It sometimes leaks fluid. It has been getting larger. It has been red and inflamed in the past. She states that a physician in [place] drained it, but denies actually having incision made. She desires definitive management.  Comorbidities include steroid-dependent rheumatoid arthritis, on prednisone and Enbrel. Hypertension. she works for a [employer], educational. HPI  Past Medical History  Diagnosis Date  . Rheumatoid arthritis   . Hypertension   . Cyst     on upper back    Past Surgical History  Procedure Date  . Cesarean section   . Abdominal hysterectomy 1999    partial  . Appendectomy 1998  . Dilation and curettage of uterus 1986, 1990, 1994, 1998    due to miscarriage    Family History  Problem Relation Age of Onset  . Heart disease Mother   . Heart disease Father     Social History History  Substance Use Topics  . Smoking status: Never Smoker   . Smokeless tobacco: Never Used  . Alcohol Use: No    Allergies  Allergen Reactions  . Tape Other (See Comments)    Patient is allergic to surgical tape.  It causes blisters.  . Tequin (Gatifloxacin) Other (See Comments)    Causes leg cramps.    Current Outpatient Prescriptions  Medication Sig Dispense Refill  . Famotidine (PEPCID PO) Take 5 mg by mouth daily.      . cholecalciferol (VITAMIN D) 1000 UNITS tablet Take 1,000 Units by mouth daily.      Marland Kitchen HYDROcodone-acetaminophen (VICODIN) 5-500 MG per tablet Take 1 tablet by mouth every 6 (six) hours as needed.  30 tablet  0    . leflunomide (ARAVA) 20 MG tablet Take 20 mg by mouth daily.      Marland Kitchen lisinopril-hydrochlorothiazide (PRINZIDE,ZESTORETIC) 20-12.5 MG per tablet Take 1 tablet by mouth daily.      . Multiple Vitamin (MULTIVITAMIN) tablet Take 1 tablet by mouth daily.      . predniSONE (DELTASONE) 5 MG tablet Take 1 tablet (5 mg total) by mouth daily.  90 tablet  0    Review of Systems Review of Systems  Constitutional: Negative for fever, chills and unexpected weight change.  HENT: Negative for hearing loss, congestion, sore throat, trouble swallowing and voice change.   Eyes: Negative for visual disturbance.  Respiratory: Negative for cough and wheezing.   Cardiovascular: Negative for chest pain, palpitations and leg swelling.  Gastrointestinal: Negative for nausea, vomiting, abdominal pain, diarrhea, constipation, blood in stool, abdominal distention and anal bleeding.  Genitourinary: Negative for hematuria, vaginal bleeding and difficulty urinating.  Musculoskeletal: Positive for myalgias and arthralgias.  Skin: Negative for rash and wound.  Neurological: Negative for seizures, syncope and headaches.  Hematological: Negative for adenopathy. Does not bruise/bleed easily.  Psychiatric/Behavioral: Negative for confusion.    Blood pressure 132/78, pulse 72, temperature 98.3 F (36.8 C), temperature source Temporal, resp. rate 14, height 5\' 7"  (1.702 m), weight 224 lb 8 oz (  101.833 kg).  Physical Exam Physical Exam  Constitutional: She is oriented to person, place, and time. She appears well-developed and well-nourished. No distress.  HENT:  Head: Normocephalic and atraumatic.  Nose: Nose normal.  Mouth/Throat: No oropharyngeal exudate.  Eyes: Conjunctivae and EOM are normal. Pupils are equal, round, and reactive to light. Right eye exhibits no discharge. Left eye exhibits no discharge.  Neck: Neck supple.  Cardiovascular: Normal rate, regular rhythm and normal heart sounds.   No murmur  heard. Pulmonary/Chest: Effort normal and breath sounds normal. No respiratory distress. She has no wheezes. She has no rales. She exhibits no tenderness.  Musculoskeletal: She exhibits no edema and no tenderness.  Neurological: She is alert and oriented to person, place, and time. She exhibits normal muscle tone. Coordination normal.  Skin: Skin is warm. No rash noted. She is not diaphoretic. No erythema. No pallor.       2 cm pilar cyst midline mid back. Not red or inflamed. Central dimpling noted.  Psychiatric: She has a normal mood and affect. Her behavior is normal. Judgment and thought content normal.    Data Reviewed Dr. Zebedee Iba notes  Assessment    2 cm pilar cyst mid back. History of infection in past. Elective excision is indicated to prevent future infection.  Steroid-dependent rheumatoid arthritis  BMI 35.16  Hypertension     Plan    Will schedule for elective excision of the cyst on her back under local anesthesia in the future.  Because of her obesity and immune compromised state we will give her a 48-hour supply of doxycycline 100 mg b.i.d. to begin the day before surgery.  I discussed the indications, details, techniques, and numerous risks of this operation. She understands these issues well. Her questions are answered. She agrees with this plan.       Angelia Mould. Derrell Lolling, M.D., [facility] General and Minimally invasive Surgery Breast and Colorectal Surgery Office:   [phone] Pager:   [phone]  06/10/2012, 9:38 AM

## 2012-06-10 NOTE — Patient Instructions (Signed)
The mass on your back is almost certainly a benign pilar cyst. To treat this definitively will require a minor operative procedure and complete excision with wound closure.  You had been given a prescription for antibiotics to begin the day before surgery.  We will schedule this surgery at your convenience.

## 2012-06-28 NOTE — H&P (Signed)
Karla Hawkins    MRN: [id number]   Description: 56 year old female  Provider: Ernestene Mention, MD  Department: [facility]       Diagnoses     Mass on back   - Primary    782.2 k        Vitals     BP Pulse Temp Resp Ht Wt    132/78 72 98.3 F (36.8 C) (Temporal) 14 5\' 7"  (1.702 m) 224 lb 8 oz (101.833 kg)    BMI - 35.16 kg/m2                History and Physical   Ernestene Mention, MD   Patient ID: Karla Hawkins, female   DOB: 05-25-1956, 56 y.o.   MRN: [id number]           HPI Karla Hawkins is a 56 y.o. female.  She is referred by Dr. Thayer Headings at [facility] for evaluation and management of a mass on her mid back.. She is frustrated and has decided to have a this managed definitively at this time.   This has been present for at least 5 years. It sometimes leaks fluid. It has been getting larger. It has been red and inflamed in the past. She states that a physician in [place] drained it, but denies actually having incision made. She desires definitive management.   Comorbidities include steroid-dependent rheumatoid arthritis, on prednisone and Enbrel. Hypertension. she works for a [employer], educational.     Past Medical History   Diagnosis  Date   .  Rheumatoid arthritis     .  Hypertension     .  Cyst         on upper back       Past Surgical History   Procedure  Date   .  Cesarean section     .  Abdominal hysterectomy  1999       partial   .  Appendectomy  1998   .  Dilation and curettage of uterus  1986, 1990, 1994, 1998       due to miscarriage       Family History   Problem  Relation  Age of Onset   .  Heart disease  Mother     .  Heart disease  Father        Social History History   Substance Use Topics   .  Smoking status:  Never Smoker    .  Smokeless tobacco:  Never Used   .  Alcohol Use:  No       Allergies   Allergen  Reactions   .  Tape  Other (See Comments)       Patient is allergic to  surgical tape.  It causes blisters.   .  Tequin (Gatifloxacin)  Other (See Comments)       Causes leg cramps.       Current Outpatient Prescriptions   Medication  Sig  Dispense  Refill   .  Famotidine (PEPCID PO)  Take 5 mg by mouth daily.         .  cholecalciferol (VITAMIN D) 1000 UNITS tablet  Take 1,000 Units by mouth daily.         Marland Kitchen  HYDROcodone-acetaminophen (VICODIN) 5-500 MG per tablet  Take 1 tablet by mouth every 6 (six) hours as needed.   30 tablet   0   .  leflunomide (ARAVA) 20 MG  tablet  Take 20 mg by mouth daily.         Marland Kitchen  lisinopril-hydrochlorothiazide (PRINZIDE,ZESTORETIC) 20-12.5 MG per tablet  Take 1 tablet by mouth daily.         .  Multiple Vitamin (MULTIVITAMIN) tablet  Take 1 tablet by mouth daily.         .  predniSONE (DELTASONE) 5 MG tablet  Take 1 tablet (5 mg total) by mouth daily.   90 tablet   0      Review of Systems  Constitutional: Negative for fever, chills and unexpected weight change.  HENT: Negative for hearing loss, congestion, sore throat, trouble swallowing and voice change.   Eyes: Negative for visual disturbance.  Respiratory: Negative for cough and wheezing.   Cardiovascular: Negative for chest pain, palpitations and leg swelling.  Gastrointestinal: Negative for nausea, vomiting, abdominal pain, diarrhea, constipation, blood in stool, abdominal distention and anal bleeding.  Genitourinary: Negative for hematuria, vaginal bleeding and difficulty urinating.  Musculoskeletal: Positive for myalgias and arthralgias.  Skin: Negative for rash and wound.  Neurological: Negative for seizures, syncope and headaches.  Hematological: Negative for adenopathy. Does not bruise/bleed easily.  Psychiatric/Behavioral: Negative for confusion.    Blood pressure 132/78, pulse 72, temperature 98.3 F (36.8 C), temperature source Temporal, resp. rate 14, height 5\' 7"  (1.702 m), weight 224 lb 8 oz (101.833 kg).   Physical Exam   Constitutional: She is  oriented to person, place, and time. She appears well-developed and well-nourished. No distress.  HENT:   Head: Normocephalic and atraumatic.   Nose: Nose normal.   Mouth/Throat: No oropharyngeal exudate.  Eyes: Conjunctivae and EOM are normal. Pupils are equal, round, and reactive to light. Right eye exhibits no discharge. Left eye exhibits no discharge.  Neck: Neck supple.  Cardiovascular: Normal rate, regular rhythm and normal heart sounds.    No murmur heard. Pulmonary/Chest: Effort normal and breath sounds normal. No respiratory distress. She has no wheezes. She has no rales. She exhibits no tenderness.  Musculoskeletal: She exhibits no edema and no tenderness.  Neurological: She is alert and oriented to person, place, and time. She exhibits normal muscle tone. Coordination normal.  Skin: Skin is warm. No rash noted. She is not diaphoretic. No erythema. No pallor.       2 cm pilar cyst midline mid back. Not red or inflamed. Central dimpling noted.  Psychiatric: She has a normal mood and affect. Her behavior is normal. Judgment and thought content normal.    Data Reviewed Dr. Zebedee Iba notes   Assessment   2 cm pilar cyst mid back. History of infection in past. Elective excision is indicated to prevent future infection.   Steroid-dependent rheumatoid arthritis   BMI 35.16   Hypertension     Plan Will schedule for elective excision of the cyst on her back under local anesthesia in the future.   Because of her obesity and immune compromised state we will give her a 48-hour supply of doxycycline 100 mg b.i.d. to begin the day before surgery.   I discussed the indications, details, techniques, and numerous risks of this operation. She understands these issues well. Her questions are answered. She agrees with this plan.       Angelia Mould. Derrell Lolling, M.D., [facility] General and Minimally invasive Surgery Breast and Colorectal Surgery Office:    [phone] Pager:   [phone]

## 2012-06-29 ENCOUNTER — Encounter (HOSPITAL_BASED_OUTPATIENT_CLINIC_OR_DEPARTMENT_OTHER)
Admission: RE | Disposition: A | Discharge status: Home / Self Care | Payer: Self-pay | Source: Ambulatory Visit | Attending: General Surgery

## 2012-06-29 ENCOUNTER — Ambulatory Visit (HOSPITAL_BASED_OUTPATIENT_CLINIC_OR_DEPARTMENT_OTHER)
Admission: RE | Admit: 2012-06-29 | Discharge: 2012-06-29 | Disposition: A | Discharge status: Home / Self Care | Payer: BC Managed Care – PPO | Source: Ambulatory Visit | Attending: General Surgery | Admitting: General Surgery

## 2012-06-29 DIAGNOSIS — L723 Sebaceous cyst: Secondary | ICD-10-CM

## 2012-06-29 DIAGNOSIS — R222 Localized swelling, mass and lump, trunk: Secondary | ICD-10-CM

## 2012-06-29 HISTORY — PX: MASS EXCISION: SHX2000

## 2012-06-29 SURGERY — MINOR EXCISION OF MASS
Anesthesia: LOCAL | Site: Back | Wound class: Clean

## 2012-06-29 MED ORDER — SODIUM BICARBONATE 4 % IV SOLN
INTRAVENOUS | Status: DC | PRN
  Administered 2012-06-29: 12:00:00

## 2012-06-29 MED ORDER — CHLORHEXIDINE GLUCONATE 4 % EX LIQD: 1.0000 "application " | Freq: Once | CUTANEOUS | Status: DC

## 2012-06-29 MED ORDER — HYDROCODONE-ACETAMINOPHEN 5-325 MG PO TABS: 1.0000 | ORAL_TABLET | ORAL | Status: AC | PRN

## 2012-06-29 MED ORDER — SODIUM BICARBONATE 4 % IV SOLN: INTRAVENOUS | Status: DC | PRN

## 2012-06-29 SURGICAL SUPPLY — 36 items
BENZOIN TINCTURE PRP APPL 2/3 (GAUZE/BANDAGES/DRESSINGS) IMPLANT
BLADE SURG 15 STRL LF DISP TIS (BLADE) ×1 IMPLANT
BLADE SURG 15 STRL SS (BLADE) ×1
CLOTH BEACON ORANGE TIMEOUT ST (SAFETY) ×2 IMPLANT
COTTONBALL LRG STERILE PKG (GAUZE/BANDAGES/DRESSINGS) IMPLANT
DERMABOND ADVANCED (GAUZE/BANDAGES/DRESSINGS) ×1
DERMABOND ADVANCED .7 DNX12 (GAUZE/BANDAGES/DRESSINGS) ×1 IMPLANT
DRAPE SURG 17X23 STRL (DRAPES) IMPLANT
ELECT REM PT RETURN 9FT ADLT (ELECTROSURGICAL)
ELECTRODE REM PT RTRN 9FT ADLT (ELECTROSURGICAL) IMPLANT
GAUZE SPONGE 4X4 12PLY STRL LF (GAUZE/BANDAGES/DRESSINGS) IMPLANT
GLOVE ECLIPSE 6.5 STRL STRAW (GLOVE) ×2 IMPLANT
GLOVE EUDERMIC 7 POWDERFREE (GLOVE) ×2 IMPLANT
MARKER SKIN DUAL TIP RULER LAB (MISCELLANEOUS) ×2 IMPLANT
NDL SAFETY ECLIPSE 18X1.5 (NEEDLE) IMPLANT
NEEDLE HYPO 18GX1.5 SHARP (NEEDLE)
NEEDLE HYPO 25X1 1.5 SAFETY (NEEDLE) ×2 IMPLANT
NS IRRIG 1000ML POUR BTL (IV SOLUTION) ×2 IMPLANT
PENCIL BUTTON HOLSTER BLD 10FT (ELECTRODE) IMPLANT
STRIP CLOSURE SKIN 1/2X4 (GAUZE/BANDAGES/DRESSINGS) IMPLANT
SUT MNCRL AB 3-0 PS2 18 (SUTURE) IMPLANT
SUT MON AB 4-0 PC3 18 (SUTURE) ×2 IMPLANT
SUT SILK 3 0 TIES 17X18 (SUTURE)
SUT SILK 3-0 18XBRD TIE BLK (SUTURE) IMPLANT
SUT VIC AB 3-0 SH 27 (SUTURE) ×1
SUT VIC AB 3-0 SH 27X BRD (SUTURE) ×1 IMPLANT
SUT VIC AB 4-0 P-3 18XBRD (SUTURE) IMPLANT
SUT VIC AB 4-0 P3 18 (SUTURE)
SUT VIC AB 5-0 P-3 18X BRD (SUTURE) IMPLANT
SUT VIC AB 5-0 P3 18 (SUTURE)
SUT VICRYL 3-0 CR8 SH (SUTURE) IMPLANT
SUT VICRYL AB 3 0 TIES (SUTURE) IMPLANT
SWABSTICK POVIDONE IODINE SNGL (MISCELLANEOUS) IMPLANT
SYR CONTROL 10ML LL (SYRINGE) ×2 IMPLANT
TOWEL OR 17X24 6PK STRL BLUE (TOWEL DISPOSABLE) IMPLANT
TRAY DSU PREP LF (CUSTOM PROCEDURE TRAY) IMPLANT

## 2012-06-29 NOTE — Op Note (Signed)
Patient Name:           Karla Hawkins   Date of Surgery:        06/29/2012  Pre op Diagnosis:        Post op Diagnosis:    Epidermoid cyst of upper mid back, 2 cm diameter  Procedure:                 same  Surgeon:                     Angelia Mould. Derrell Lolling, M.D., FACS  Assistant:                      none  Operative Indications:   Karla Hawkins is a 56 y.o. female. She was referred by Dr. Thayer Headings at [facility] for evaluation and management of a mass on her mid back.. She is frustrated and has decided to have a this managed definitively at this time.  This has been present for at least 5 years. It sometimes leaks fluid. It has been getting larger. It has been red and inflamed in the past. She states that a physician in [place] drained it, but denies actually having incision made. She desires definitive management. My exam in the office reveals a 2 cm epidermoid cyst in the upper mid back. It is not acutely inflamed. Comorbidities include steroid-dependent rheumatoid arthritis, on prednisone and Enbrel. Hypertension. she works for a [employer], educational.   Operative Findings:      Gross findings are consistent with a benign pilar cyst   Procedure in Detail:      The patient was brought to room 4 at [facility] and turned in a lateral decubitus position. The upper back was prepped and draped in a sterile fashion. Surgical time out was performed. 1% Xylocaine with epinephrine was used as a local infiltration anesthetic. A transverse elliptical incision was made and I excised a 2 cm cystic mass which looked like a pilar cyst. This was completely excised including all the cyst wall. We  irrigated with saline. There was no purulence. There was no residual cyst  tissue. Subcutaneous tissue was closed with 3-0 Vicryl sutures and the skin closed with interrupted subcuticular sutures of 4-0 Monocryl and Dermabond. The patient tolerated the procedure well. There were no  complications. EBL 2 cc.. Counts correct.         Angelia Mould. Derrell Lolling, M.D., FACS General and Minimally Invasive Surgery Breast and Colorectal Surgery  06/29/2012 12:18 PM

## 2012-06-29 NOTE — Interval H&P Note (Signed)
History and Physical Interval Note:  06/29/2012 11:54 AM  Karla Hawkins  has presented today for surgery, with the diagnosis of cyst of back  The goals of treatment and the  various methods of treatment have been discussed with the patient and family. After consideration of risks, benefits and other options for treatment, the patient has consented to  Procedure(s) (LRB): MINOR EXCISION OF MASS (N/A) as a surgical intervention .  The patient's history has been reviewed, patient examined, no change in status, stable for surgery.  I have reviewed the patients' chart and labs.  Questions were answered to the patient's satisfaction.     Ernestene Mention

## 2012-07-01 NOTE — Progress Notes (Signed)
Quick Note:  Inform patient of Pathology report,. ______ 

## 2012-07-02 ENCOUNTER — Encounter (HOSPITAL_BASED_OUTPATIENT_CLINIC_OR_DEPARTMENT_OTHER): Payer: Self-pay | Admitting: General Surgery

## 2012-07-07 ENCOUNTER — Telehealth (INDEPENDENT_AMBULATORY_CARE_PROVIDER_SITE_OTHER): Payer: Self-pay | Admitting: General Surgery

## 2012-07-07 NOTE — Telephone Encounter (Signed)
Patient returned call and stated that the incision site has healed fine, she has not had any problems. The adhesive glue used came off Monday per patient. She stated no sign of infection, site does not have any open sites, has closed completely. Patient agreed to have the pathology report mailed to her. Confirmed the address in system is current for the patient.

## 2012-07-07 NOTE — Telephone Encounter (Signed)
Called and left message for patient to return call. Per Dr. Derrell Lolling, need to know how her excision site is healing. If she has healed well and no sign of infection, there is no need for follow up with our office. Pathology can be mailed to her if she would prefer. The test was negative for any atypia or malignancy.  Only schedule appointment to see Dr. Derrell Lolling if she is having complications.

## 2012-11-30 ENCOUNTER — Other Ambulatory Visit: Payer: Self-pay | Admitting: Internal Medicine

## 2012-11-30 DIAGNOSIS — Z1231 Encounter for screening mammogram for malignant neoplasm of breast: Secondary | ICD-10-CM

## 2012-12-07 LAB — HM COLONOSCOPY

## 2013-01-04 ENCOUNTER — Ambulatory Visit
Admission: RE | Admit: 2013-01-04 | Discharge: 2013-01-04 | Disposition: A | Discharge status: Home / Self Care | Payer: BC Managed Care – PPO | Source: Ambulatory Visit | Attending: Internal Medicine | Admitting: Internal Medicine

## 2013-01-04 DIAGNOSIS — Z1231 Encounter for screening mammogram for malignant neoplasm of breast: Secondary | ICD-10-CM

## 2013-12-09 ENCOUNTER — Other Ambulatory Visit: Payer: Self-pay

## 2013-12-09 DIAGNOSIS — Z1231 Encounter for screening mammogram for malignant neoplasm of breast: Secondary | ICD-10-CM

## 2014-01-11 ENCOUNTER — Ambulatory Visit: Payer: BC Managed Care – PPO

## 2014-01-30 ENCOUNTER — Ambulatory Visit
Admission: RE | Admit: 2014-01-30 | Discharge: 2014-01-30 | Disposition: A | Discharge status: Home / Self Care | Payer: BC Managed Care – PPO | Source: Ambulatory Visit

## 2014-01-30 DIAGNOSIS — Z1231 Encounter for screening mammogram for malignant neoplasm of breast: Secondary | ICD-10-CM

## 2014-02-03 ENCOUNTER — Other Ambulatory Visit: Payer: Self-pay | Admitting: Internal Medicine

## 2014-02-03 DIAGNOSIS — R928 Other abnormal and inconclusive findings on diagnostic imaging of breast: Secondary | ICD-10-CM

## 2014-02-13 ENCOUNTER — Ambulatory Visit
Admission: RE | Admit: 2014-02-13 | Discharge: 2014-02-13 | Disposition: A | Discharge status: Home / Self Care | Payer: BC Managed Care – PPO | Source: Ambulatory Visit | Attending: Internal Medicine | Admitting: Internal Medicine

## 2014-02-13 DIAGNOSIS — R928 Other abnormal and inconclusive findings on diagnostic imaging of breast: Secondary | ICD-10-CM

## 2015-01-26 ENCOUNTER — Other Ambulatory Visit: Payer: Self-pay

## 2015-01-26 DIAGNOSIS — Z1231 Encounter for screening mammogram for malignant neoplasm of breast: Secondary | ICD-10-CM

## 2015-02-19 ENCOUNTER — Ambulatory Visit
Admission: RE | Admit: 2015-02-19 | Discharge: 2015-02-19 | Disposition: A | Discharge status: Home / Self Care | Payer: BLUE CROSS/BLUE SHIELD | Source: Ambulatory Visit

## 2015-02-19 DIAGNOSIS — Z1231 Encounter for screening mammogram for malignant neoplasm of breast: Secondary | ICD-10-CM

## 2015-04-04 LAB — LIPID PANEL
CHOLESTEROL: 277 mg/dL — AB (ref 0–200)
HDL: 59 mg/dL (ref 35–70)
LDL CALC: 201 mg/dL
TRIGLYCERIDES: 84 mg/dL (ref 40–160)

## 2015-04-04 LAB — HEPATIC FUNCTION PANEL
ALT: 52 U/L — AB (ref 7–35)
AST: 33 U/L (ref 13–35)
Alkaline Phosphatase: 37 U/L (ref 25–125)

## 2015-04-04 LAB — BASIC METABOLIC PANEL
Creatinine: 0.6 mg/dL (ref 0.5–1.1)
Glucose: 93 mg/dL
POTASSIUM: 4.3 mmol/L (ref 3.4–5.3)

## 2015-04-04 LAB — CBC AND DIFFERENTIAL
Hemoglobin: 15 g/dL (ref 12.0–16.0)
Platelets: 161 10*3/uL (ref 150–399)
WBC: 6 10^3/mL

## 2015-04-04 LAB — TSH: TSH: 1.12 u[IU]/mL (ref 0.41–5.90)

## 2015-06-11 ENCOUNTER — Encounter: Payer: Self-pay | Admitting: Family Medicine

## 2015-06-11 ENCOUNTER — Ambulatory Visit (INDEPENDENT_AMBULATORY_CARE_PROVIDER_SITE_OTHER): Payer: BLUE CROSS/BLUE SHIELD | Admitting: Family Medicine

## 2015-06-11 VITALS — BP 138/76 | HR 64 | Ht 67.0 in | Wt 205.0 lb

## 2015-06-11 DIAGNOSIS — M069 Rheumatoid arthritis, unspecified: Secondary | ICD-10-CM | POA: Diagnosis not present

## 2015-06-11 DIAGNOSIS — K2100 Gastro-esophageal reflux disease with esophagitis, without bleeding: Secondary | ICD-10-CM | POA: Insufficient documentation

## 2015-06-11 DIAGNOSIS — I10 Essential (primary) hypertension: Secondary | ICD-10-CM | POA: Diagnosis not present

## 2015-06-11 DIAGNOSIS — E785 Hyperlipidemia, unspecified: Secondary | ICD-10-CM

## 2015-06-11 DIAGNOSIS — I714 Abdominal aortic aneurysm, without rupture, unspecified: Secondary | ICD-10-CM

## 2015-06-11 MED ORDER — OMEPRAZOLE 40 MG PO CPDR: DELAYED_RELEASE_CAPSULE | ORAL | Status: DC

## 2015-06-11 NOTE — Progress Notes (Signed)
CC: Karla Hawkins is a 59 y.o. female is here for Establish Care   Subjective: HPI:  Very pleasant 59 year old here to establish care  Reports a history of rheumatoid arthritis that was diagnosed in her 30s. Her current medication regimen has significantly improved her diffuse joint pain other than in her feet. She sees a rheumatologist in [place]. Currently denies any swelling redness or warmth of any joint in her body.  History of essential hypertension: She has been taking the Cipro-hydrochlorothiazide for an unknown amount of time but on a daily basis. No outside blood pressures to report that she believes that her blood pressure has been well controlled on this medication. She denies any known side effects. She's lost 25 pounds over the past 6 months with reducing carbohydrates in her diet. Currently no formal exercise routine.  History of esophageal reflux and gastric ulcer. She was diagnosed with this about a year ago and was then started on Prilosec. She ran out of this medication 2 weeks ago and had a slight return of a epigastric discomfort and what she describes as esophageal sensitivity, further described as pain after swallowing with a sensation peristalsis towards the stomach. She denies any difficult swallowing or radiation of her pain. Symptoms are fairly mild in severity  2013 she was told that she has what might be an aortic aneurysm however was under the impression from what her emergency room physician told her that it was not something that needs to be followed.  Reports a history of hyperlipidemia found after she had been on a rheumatoid arthritis medication for a few months, she forgets the name of the exact drug. This was stopped but then never followed up on. She thinks it's been at least 3 months since this medication was stopped.  Review of Systems - General ROS: negative for - chills, fever, night sweats, weight gain or weight loss Ophthalmic ROS: negative for -  decreased vision Psychological ROS: negative for - anxiety or depression ENT ROS: negative for - hearing change, nasal congestion, tinnitus or allergies Hematological and Lymphatic ROS: negative for - bleeding problems, bruising or swollen lymph nodes Breast ROS: negative Respiratory ROS: no cough, shortness of breath, or wheezing Cardiovascular ROS: no chest pain or dyspnea on exertion Gastrointestinal ROS: no abdominal pain, change in bowel habits, or black or bloody stools Genito-Urinary ROS: negative for - genital discharge, genital ulcers, incontinence or abnormal bleeding from genitals Musculoskeletal ROS: negative for - joint pain or muscle pain, other than that described above Neurological ROS: negative for - headaches or memory loss Dermatological ROS: negative for lumps, mole changes, rash and skin lesion changes  Past Medical History  Diagnosis Date  . Rheumatoid arthritis(714.0)   . Hypertension   . Cyst     on upper back  . Hyperlipidemia     Past Surgical History  Procedure Laterality Date  . Cesarean section    . Abdominal hysterectomy  1999    partial  . Appendectomy  1998  . Dilation and curettage of uterus  1986, 1990, 1994, 1998    due to miscarriage  . Mass excision  06/29/2012    Procedure: MINOR EXCISION OF MASS;  Surgeon: Ernestene Mention, MD;  Location: [facility];  Service: General;  Laterality: N/A;  excise cyst of back   Family History  Problem Relation Age of Onset  . Heart disease Mother   . Heart disease Father     History   Social History  .  Marital Status: Married    Spouse Name: N/A  . Number of Children: N/A  . Years of Education: N/A   Occupational History  . Not on file.   Social History Main Topics  . Smoking status: Never Smoker   . Smokeless tobacco: Never Used  . Alcohol Use: No  . Drug Use: No  . Sexual Activity: No   Other Topics Concern  . Not on file   Social History Narrative     Objective: BP  138/76 mmHg  Pulse 64  Ht 5\' 7"  (1.702 m)  Wt 205 lb (92.987 kg)  BMI 32.10 kg/m2  General: Alert and Oriented, No Acute Distress HEENT: Pupils equal, round, reactive to light. Conjunctivae clear.  Moist mucous membranes Lungs: Clear to auscultation bilaterally, no wheezing/ronchi/rales.  Comfortable work of breathing. Good air movement. Cardiac: Regular rate and rhythm. Normal S1/S2.  No murmurs, rubs, nor gallops.   Extremities: No peripheral edema.  Strong peripheral pulses.  Mental Status: No depression, anxiety, nor agitation. Skin: Warm and dry.  Assessment & Plan: [name] was seen today for establish care.  Diagnoses and all orders for this visit:  Chronic rheumatic arthritis  Essential hypertension  Gastroesophageal reflux disease with esophagitis Orders: -     omeprazole (PRILOSEC) 40 MG capsule; One by mouth daily at least one hour before a meal.  Abdominal aortic aneurysm Orders: -     Cancel: Alvino Chapel Aorta; Future -     US Aorta  Hyperlipidemia Orders: -     Lipid panel   Rheumatoid arthritis: Stable, advised to continue to see her rheumatologist for management of this Essential hypertension: Controlled, continue with Cipro-Hydrocort thiazide, requesting outside records of renal function GERD: Worsening therefore restart Prilosec Abdominal aortic aneurysm: Obtain ultrasound of the abdomen for surveillance of this possible abnormality, radiology report from 2013 notes that imaging at that time was obscured by bowel gas Hyperlipidemia: Repeat lipid panel due  Ultimate follow-up will be based on lipid panel results  45 minutes spent face-to-face during visit today of which at least 50% was counseling or coordinating care regarding: 1. Chronic rheumatic arthritis   2. Essential hypertension   3. Gastroesophageal reflux disease with esophagitis   4. Abdominal aortic aneurysm   5. Hyperlipidemia      Return if symptoms worsen or fail to improve.

## 2015-06-22 ENCOUNTER — Telehealth: Payer: Self-pay | Admitting: Family Medicine

## 2015-06-22 DIAGNOSIS — E785 Hyperlipidemia, unspecified: Secondary | ICD-10-CM

## 2015-06-22 LAB — LIPID PANEL
CHOLESTEROL: 211 mg/dL — AB (ref 0–200)
HDL: 55 mg/dL (ref >=46)
LDL Cholesterol: 142 mg/dL — ABNORMAL HIGH (ref 0–99)
TRIGLYCERIDES: 72 mg/dL (ref <150)
Total CHOL/HDL Ratio: 3.8 Ratio
VLDL: 14 mg/dL (ref 0–40)

## 2015-06-22 NOTE — Telephone Encounter (Signed)
Pt notified and labs mailed per patient request

## 2015-06-22 NOTE — Telephone Encounter (Signed)
Karla Hawkins, Will you please let patient know that her cholesterol test confirmed that her LDL cholesterol is moderately elevated however it's not high enough to warrant cholesterol lowering medication at this time.  This will change with age and she may need to restart it in the future therefore I'd recommend repeating this in 6-12 months. F/u with me in three months to recheck blood pressure

## 2015-06-25 ENCOUNTER — Ambulatory Visit (HOSPITAL_BASED_OUTPATIENT_CLINIC_OR_DEPARTMENT_OTHER)
Admission: RE | Admit: 2015-06-25 | Discharge: 2015-06-25 | Disposition: A | Discharge status: Home / Self Care | Payer: BLUE CROSS/BLUE SHIELD | Source: Ambulatory Visit | Attending: Family Medicine | Admitting: Family Medicine

## 2015-06-25 DIAGNOSIS — I77819 Aortic ectasia, unspecified site: Secondary | ICD-10-CM | POA: Insufficient documentation

## 2015-07-12 ENCOUNTER — Encounter: Payer: Self-pay | Admitting: Family Medicine

## 2015-07-12 DIAGNOSIS — Z86010 Personal history of colonic polyps: Secondary | ICD-10-CM | POA: Insufficient documentation

## 2015-07-24 ENCOUNTER — Encounter: Payer: Self-pay | Admitting: Family Medicine

## 2015-09-11 ENCOUNTER — Ambulatory Visit: Payer: BLUE CROSS/BLUE SHIELD | Admitting: Family Medicine

## 2015-11-06 ENCOUNTER — Ambulatory Visit (INDEPENDENT_AMBULATORY_CARE_PROVIDER_SITE_OTHER): Payer: BLUE CROSS/BLUE SHIELD | Admitting: Family Medicine

## 2015-11-06 ENCOUNTER — Encounter: Payer: Self-pay | Admitting: Family Medicine

## 2015-11-06 VITALS — BP 142/79 | HR 59 | Wt 191.0 lb

## 2015-11-06 DIAGNOSIS — N63 Unspecified lump in unspecified breast: Secondary | ICD-10-CM

## 2015-11-06 DIAGNOSIS — L089 Local infection of the skin and subcutaneous tissue, unspecified: Secondary | ICD-10-CM

## 2015-11-06 DIAGNOSIS — L723 Sebaceous cyst: Secondary | ICD-10-CM

## 2015-11-06 MED ORDER — CLINDAMYCIN HCL 300 MG PO CAPS: 300.0000 mg | ORAL_CAPSULE | Freq: Three times a day (TID) | ORAL | Status: DC

## 2015-11-06 MED ORDER — TRIAMCINOLONE ACETONIDE 0.1 % EX CREA: TOPICAL_CREAM | CUTANEOUS | Status: AC

## 2015-11-06 NOTE — Progress Notes (Signed)
CC: Karla Hawkins is a 59 y.o. female is here for Breast Mass   Subjective: HPI:   2-3 weeks ago she noticed a pea-sized mass in the bottom medial aspect of the right breast. Since then on a daily basis she's been trying to squeeze it to see if she can pop it. It's painful to the touch but otherwise does not bother her. She's not had anything  Break out through the skin yet. She denies any swollen lymph nodes, unintentional weight loss or breast architectural changes other than this mass. There has been no nipple discharge or disfiguration. She had a normal  Screening mammogram in February of this year. Denies fevers, chills or chest pain.   Review Of Systems Outlined In HPI  Past Medical History  Diagnosis Date  . Rheumatoid arthritis(714.0)   . Hypertension   . Cyst     on upper back  . Hyperlipidemia     Past Surgical History  Procedure Laterality Date  . Cesarean section    . Abdominal hysterectomy  1999    partial  . Appendectomy  1998  . Dilation and curettage of uterus  1986, 1990, 1994, 1998    due to miscarriage  . Mass excision  06/29/2012    Procedure: MINOR EXCISION OF MASS;  Surgeon: Ernestene Mention, MD;  Location: [facility];  Service: General;  Laterality: N/A;  excise cyst of back   Family History  Problem Relation Age of Onset  . Heart disease Mother   . Heart disease Father     Social History   Social History  . Marital Status: Married    Spouse Name: N/A  . Number of Children: N/A  . Years of Education: N/A   Occupational History  . Not on file.   Social History Main Topics  . Smoking status: Never Smoker   . Smokeless tobacco: Never Used  . Alcohol Use: No  . Drug Use: No  . Sexual Activity: No   Other Topics Concern  . Not on file   Social History Narrative     Objective: BP 142/79 mmHg  Pulse 59  Wt 191 lb (86.637 kg)  Vital signs reviewed. General: Alert and Oriented, No Acute Distress HEENT: Pupils equal, round,  reactive to light. Conjunctivae clear.  External ears unremarkable.  Moist mucous membranes. Lungs: Clear and comfortable work of breathing, speaking in full sentences without accessory muscle use. Cardiac: Regular rate and rhythm.  Breast: Karla Hawkins as chaperone: at the 4:00 position of the right breast there is a mobile firm cystic structure approximately 1 cm in diameter with a central pore. Neuro: CN II-XII grossly intact, gait normal. Extremities: No peripheral edema.  Strong peripheral pulses.  Mental Status: No depression, anxiety, nor agitation. Logical though process. Skin: Warm and dry.  Assessment & Plan: Karla Hawkins was seen today for breast mass.  Diagnoses and all orders for this visit:  Infected sebaceous cyst -     triamcinolone cream (KENALOG) 0.1 %; Apply to affected areas twice a day for up to two weeks, avoid face. -     clindamycin (CLEOCIN) 300 MG capsule; Take 1 capsule (300 mg total) by mouth 3 (three) times daily.  Breast mass   Highly likely that her masses due to a infected or inflamed sebaceous cyst. Start clindamycin and triamcinolone cream. If no better in 2 weeks notify me as soon as possible and I will help arrange a diagnostic mammogram.  Return in about 4 weeks (around  12/04/2015), or if symptoms worsen or fail to improve.

## 2015-11-27 ENCOUNTER — Ambulatory Visit (INDEPENDENT_AMBULATORY_CARE_PROVIDER_SITE_OTHER): Payer: BLUE CROSS/BLUE SHIELD | Admitting: Family Medicine

## 2015-11-27 ENCOUNTER — Encounter: Payer: Self-pay | Admitting: Family Medicine

## 2015-11-27 VITALS — BP 125/76 | HR 72 | Wt 190.0 lb

## 2015-11-27 DIAGNOSIS — Z23 Encounter for immunization: Secondary | ICD-10-CM

## 2015-11-27 DIAGNOSIS — Z0000 Encounter for general adult medical examination without abnormal findings: Secondary | ICD-10-CM | POA: Diagnosis not present

## 2015-11-27 DIAGNOSIS — I10 Essential (primary) hypertension: Secondary | ICD-10-CM | POA: Diagnosis not present

## 2015-11-27 MED ORDER — LISINOPRIL-HYDROCHLOROTHIAZIDE 20-12.5 MG PO TABS: 1.0000 | ORAL_TABLET | Freq: Every day | ORAL | Status: DC

## 2015-11-27 NOTE — Progress Notes (Signed)
CC: Karla Hawkins is a 59 y.o. female is here for Annual Exam and skin lesion   Subjective: HPI:  Colonoscopy: performed December 2013 repeat 2018 Papsmear: no current indication, hysterectomy Mammogram: obtaining diagnostic mammogram for right sided breast mass.  Influenza Vaccine: will receive today Pneumovax: no current indication Td/Tdap: up to date from 2011 Zoster: (Start 59 yo)  The mass on her right breast eventually started draining a puslike material. A firm knot still remains in the site with overlying redness. She denies breast pain or any other architectural changes. No fevers or chills.  Review of Systems - General ROS: negative for - chills, fever, night sweats, weight gain or weight loss Ophthalmic ROS: negative for - decreased vision Psychological ROS: negative for - anxiety or depression ENT ROS: negative for - hearing change, nasal congestion, tinnitus or allergies Hematological and Lymphatic ROS: negative for - bleeding problems, bruising or swollen lymph nodes Respiratory ROS: no cough, shortness of breath, or wheezing Cardiovascular ROS: no chest pain or dyspnea on exertion Gastrointestinal ROS: no abdominal pain, change in bowel habits, or black or bloody stools Genito-Urinary ROS: negative for - genital discharge, genital ulcers, incontinence or abnormal bleeding from genitals Musculoskeletal ROS: negative for - joint pain or muscle pain Neurological ROS: negative for - headaches or memory loss Dermatological ROS: negative for lumps, mole changes, rash and skin lesion changes  Past Medical History  Diagnosis Date  . Rheumatoid arthritis(714.0)   . Hypertension   . Cyst     on upper back  . Hyperlipidemia     Past Surgical History  Procedure Laterality Date  . Cesarean section    . Abdominal hysterectomy  1999    partial  . Appendectomy  1998  . Dilation and curettage of uterus  1986, 1990, 1994, 1998    due to miscarriage  . Mass excision  06/29/2012     Procedure: MINOR EXCISION OF MASS;  Surgeon: Ernestene Mention, MD;  Location: [facility];  Service: General;  Laterality: N/A;  excise cyst of back   Family History  Problem Relation Age of Onset  . Heart disease Mother   . Heart disease Father     Social History   Social History  . Marital Status: Married    Spouse Name: N/A  . Number of Children: N/A  . Years of Education: N/A   Occupational History  . Not on file.   Social History Main Topics  . Smoking status: Never Smoker   . Smokeless tobacco: Never Used  . Alcohol Use: No  . Drug Use: No  . Sexual Activity: No   Other Topics Concern  . Not on file   Social History Narrative     Objective: BP 125/76 mmHg  Pulse 72  Wt 190 lb (86.183 kg)  General: No Acute Distress HEENT: Atraumatic, normocephalic, conjunctivae normal without scleral icterus.  No nasal discharge, hearing grossly intact, TMs with good landmarks bilaterally with no middle ear abnormalities, posterior pharynx clear without oral lesions. Neck: Supple, trachea midline, no cervical nor supraclavicular adenopathy. Pulmonary: Clear to auscultation bilaterally without wheezing, rhonchi, nor rales. Cardiac: Regular rate and rhythm.  No murmurs, rubs, nor gallops. No peripheral edema.  2+ peripheral pulses bilaterally. On the medial aspect of the right breast there is a approximately 1 cm diameter firm nodule underneath the skin. MSK: Grossly intact, no signs of weakness.  Full strength throughout upper and lower extremities.  Full ROM in upper and lower extremities.  No midline spinal tenderness. Neuro: Gait unremarkable, CN II-XII grossly intact.  C5-C6 Reflex 2/4 Bilaterally, L4 Reflex 2/4 Bilaterally.  Cerebellar function intact. Skin: No rashes. Psych: Alert and oriented to person/place/time.  Thought process normal. No anxiety/depression.   Assessment & Plan: [name] was seen today for annual exam and skin lesion.  Diagnoses and all  orders for this visit:  Annual physical exam  Essential hypertension -     lisinopril-hydrochlorothiazide (PRINZIDE,ZESTORETIC) 20-12.5 MG tablet; Take 1 tablet by mouth daily.   I'm fairly confident that her breast mass is a sebaceous cyst that was infected and also remains somewhat inflamed.I would first like to get a diagnostic mammogram to ensure that there is not any other suspicious findings nearby  Healthy lifestyle interventions including but not limited to regular exercise, a healthy low fat diet, moderation of salt intake, the dangers of tobacco/alcohol/recreational drug use, nutrition supplementation, and accident avoidance were discussed with the patient and a handout was provided for future reference.  Return in about 4 weeks (around 12/25/2015) for Possible cyst removal.

## 2015-11-30 ENCOUNTER — Other Ambulatory Visit: Payer: Self-pay

## 2015-11-30 DIAGNOSIS — N6325 Unspecified lump in the left breast, overlapping quadrants: Secondary | ICD-10-CM

## 2015-11-30 DIAGNOSIS — N6320 Unspecified lump in the left breast, unspecified quadrant: Principal | ICD-10-CM

## 2015-12-06 ENCOUNTER — Ambulatory Visit
Admission: RE | Admit: 2015-12-06 | Discharge: 2015-12-06 | Disposition: A | Discharge status: Home / Self Care | Payer: BLUE CROSS/BLUE SHIELD | Source: Ambulatory Visit | Attending: Family Medicine | Admitting: Family Medicine

## 2015-12-06 DIAGNOSIS — N6325 Unspecified lump in the left breast, overlapping quadrants: Secondary | ICD-10-CM

## 2015-12-06 DIAGNOSIS — N6320 Unspecified lump in the left breast, unspecified quadrant: Principal | ICD-10-CM

## 2015-12-10 ENCOUNTER — Ambulatory Visit (INDEPENDENT_AMBULATORY_CARE_PROVIDER_SITE_OTHER): Payer: BLUE CROSS/BLUE SHIELD | Admitting: Family Medicine

## 2015-12-10 ENCOUNTER — Encounter: Payer: Self-pay | Admitting: Family Medicine

## 2015-12-10 VITALS — BP 138/74 | HR 70 | Wt 192.0 lb

## 2015-12-10 DIAGNOSIS — L723 Sebaceous cyst: Secondary | ICD-10-CM

## 2015-12-10 NOTE — Progress Notes (Signed)
CC: Karla Hawkins is a 59 y.o. female is here for Cysto Stent Removal   Subjective: HPI:   Right sided breast pain, medial and inferior to the nipple.  Slight improvement with antibiotics and topical steroid.  Pain is mild.  Not getting better or worse with time.  Recent US confirmed that this is most likely benign.  Denies skin changes elsewhere. No fevers, chills, weight loss, nor swollen lymph nodes.  Review Of Systems Outlined In HPI  Past Medical History  Diagnosis Date  . Rheumatoid arthritis(714.0)   . Hypertension   . Cyst     on upper back  . Hyperlipidemia     Past Surgical History  Procedure Laterality Date  . Cesarean section    . Abdominal hysterectomy  1999    partial  . Appendectomy  1998  . Dilation and curettage of uterus  1986, 1990, 1994, 1998    due to miscarriage  . Mass excision  06/29/2012    Procedure: MINOR EXCISION OF MASS;  Surgeon: Ernestene Mention, MD;  Location: [facility];  Service: General;  Laterality: N/A;  excise cyst of back   Family History  Problem Relation Age of Onset  . Heart disease Mother   . Heart disease Father     Social History   Social History  . Marital Status: Married    Spouse Name: N/A  . Number of Children: N/A  . Years of Education: N/A   Occupational History  . Not on file.   Social History Main Topics  . Smoking status: Never Smoker   . Smokeless tobacco: Never Used  . Alcohol Use: No  . Drug Use: No  . Sexual Activity: No   Other Topics Concern  . Not on file   Social History Narrative     Objective: BP 138/74 mmHg  Pulse 70  Wt 192 lb (87.091 kg)  General: Alert and Oriented, No Acute Distress HEENT: Pupils equal, round, reactive to light. Conjunctivae clear.  Moist mucous membranes Lungs: Clear to auscultation bilaterally,  Cardiac: Regular rate and rhythm.  Right Breast: firm painful nodule under the skin approx. 77mm in diameter at the 3:30 position to the nipple Mental Status:  No depression, anxiety, nor agitation. Skin: Warm and dry.  Assessment & Plan: Ally was seen today for cysto stent removal.  Diagnoses and all orders for this visit:  Inflamed epidermoid cyst of skin   Incision and Drainage Procedure Note  Pre-operative Diagnosis: inflammed epidermoid cyst  Post-operative Diagnosis: same  Indications: pain  Anesthesia: 1% lidocaine with epinephrine  Procedure Details  The procedure, risks and complications have been discussed in detail (including, but not limited to airway compromise, infection, bleeding) with the patient, and the patient has signed consent to the procedure.  The skin was sterilely prepped and draped over the affected area in the usual fashion. After adequate local anesthesia, I&D with a 36mm punch was performed on the right breast. Purulent drainage: absent, cyst removed. Single prolene stitch used The patient was observed until stable.  Findings: Successful cyst removal  EBL: 2 cc's  Drains: none  Condition: Tolerated procedure well   Complications: none.    Return in about 1 week (around 12/17/2015), or if symptoms worsen or fail to improve.

## 2015-12-13 DIAGNOSIS — Z23 Encounter for immunization: Secondary | ICD-10-CM | POA: Diagnosis not present

## 2015-12-19 ENCOUNTER — Ambulatory Visit (INDEPENDENT_AMBULATORY_CARE_PROVIDER_SITE_OTHER): Payer: BLUE CROSS/BLUE SHIELD | Admitting: Family Medicine

## 2015-12-19 DIAGNOSIS — Z4802 Encounter for removal of sutures: Secondary | ICD-10-CM

## 2015-12-19 NOTE — Progress Notes (Signed)
   Subjective:    Patient ID: Karla Hawkins, female    DOB: Mar 24, 1956, 59 y.o.   MRN: [id number]  HPI  Patient comes in for a suture removal. No complaints of drainage or signs of infection  Review of Systems     Objective:   Physical Exam        Assessment & Plan:   Removed single suture from under right breast.  Patient tolerated procedure well, no complications or skin irritation noted.

## 2016-03-14 LAB — COMPLETE METABOLIC PANEL WITH GFR: CALCIUM: 9 mg/dL

## 2016-03-14 LAB — CBC AND DIFFERENTIAL
HCT: 40 % (ref 36–46)
HEMOGLOBIN: 13.4 g/dL (ref 12.0–16.0)
Platelets: 210 10*3/uL (ref 150–399)
WBC: 7.9 10^3/mL

## 2016-03-14 LAB — BASIC METABOLIC PANEL
CREATININE: 0.5 mg/dL (ref <=1.1)
GLUCOSE: 104 mg/dL
POTASSIUM: 4.1 mmol/L (ref 3.4–5.3)
Sodium: 145 mmol/L (ref 137–147)

## 2016-03-14 LAB — HEPATIC FUNCTION PANEL
ALT: 17 U/L (ref 7–35)
AST: 24 U/L (ref 13–35)

## 2016-03-20 ENCOUNTER — Encounter: Payer: Self-pay | Admitting: Family Medicine

## 2016-04-08 IMAGING — US US BREAST LTD UNI RIGHT INC AXILLA
1 series · 11 of 11 positions shown · non-contrast
Comparison: Previous exam(s).

CLINICAL DATA: Recent large boil in the lower outer right breast
adjacent to a previous probable sebaceous cyst in the lower inner
right breast. The boil has become much smaller and flatter following
antibiotic treatment. She is scheduled for excision of the probable
sebaceous cyst.

EXAM:
DIGITAL DIAGNOSTIC RIGHT MAMMOGRAM WITH 3D TOMOSYNTHESIS WITH CAD
ULTRASOUND RIGHT BREAST

[Series 1: advbreast · 11 of 11 slices shown]
[im 1/11]
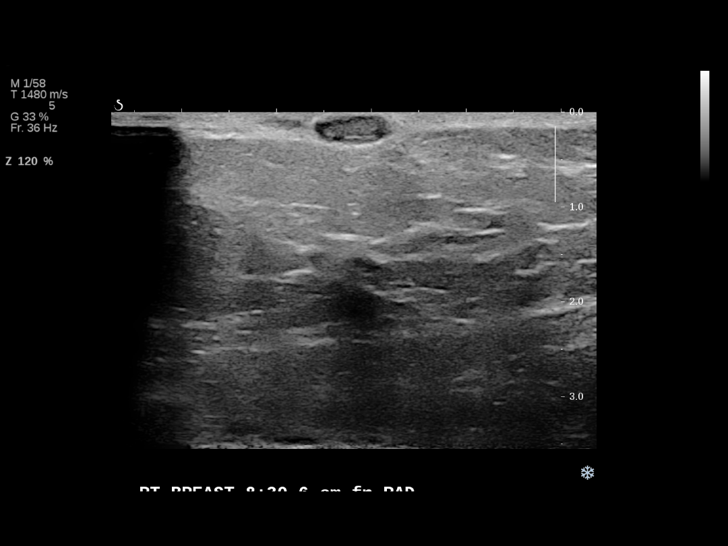
[im 2/11]
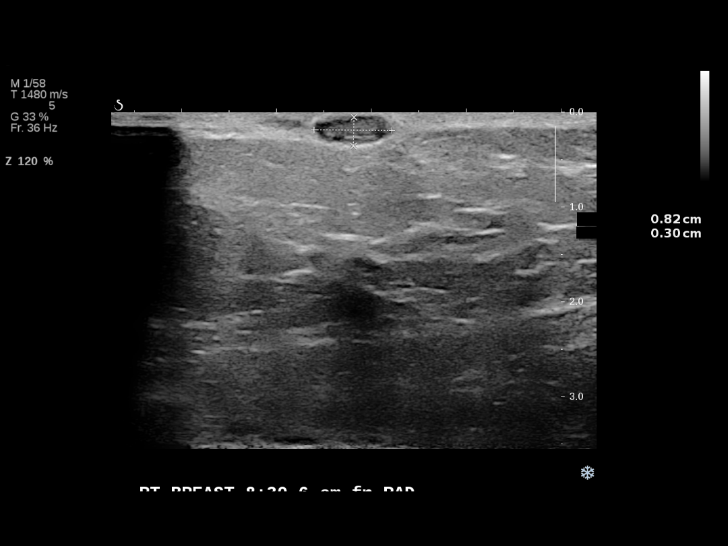
[im 3/11]
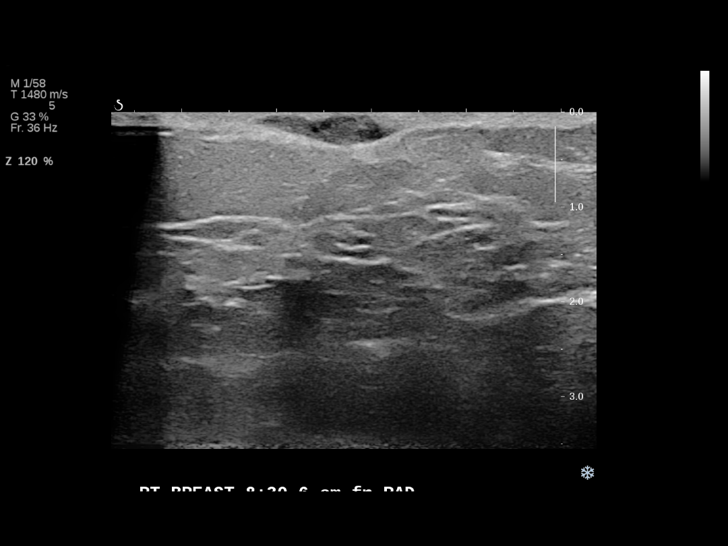
[im 4/11]
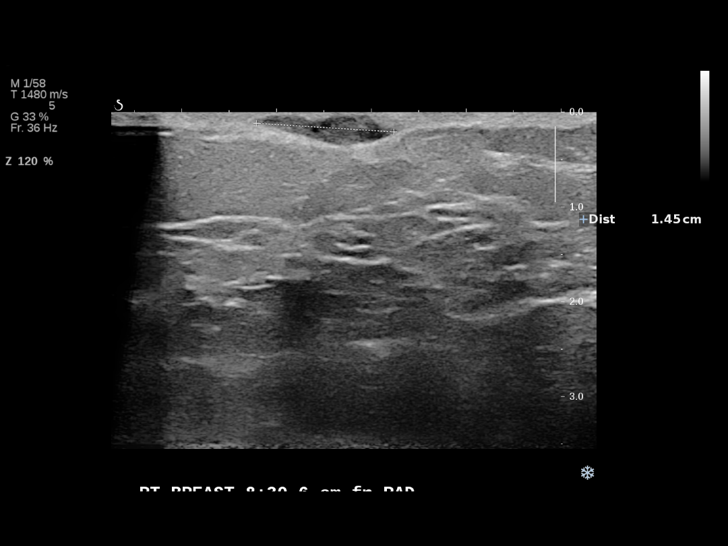
[im 5/11]
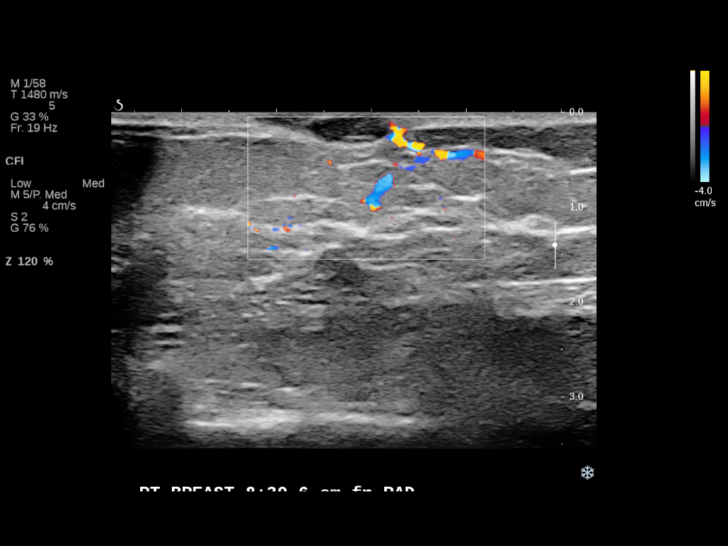
[im 6/11]
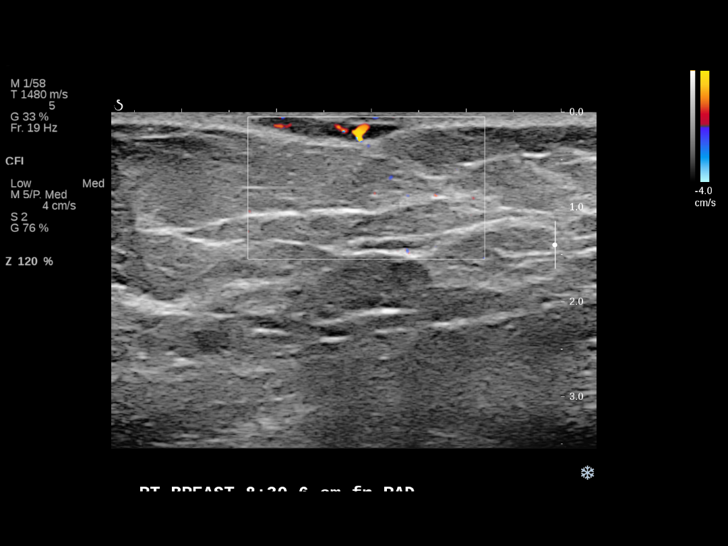
[im 7/11]
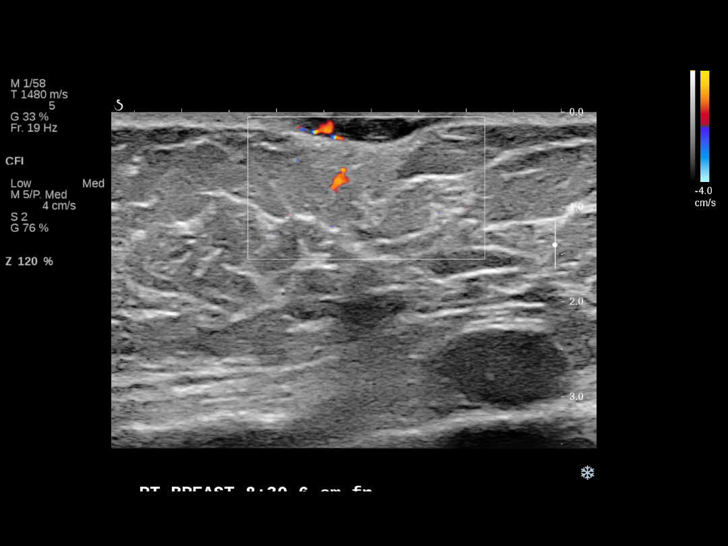
[im 8/11]
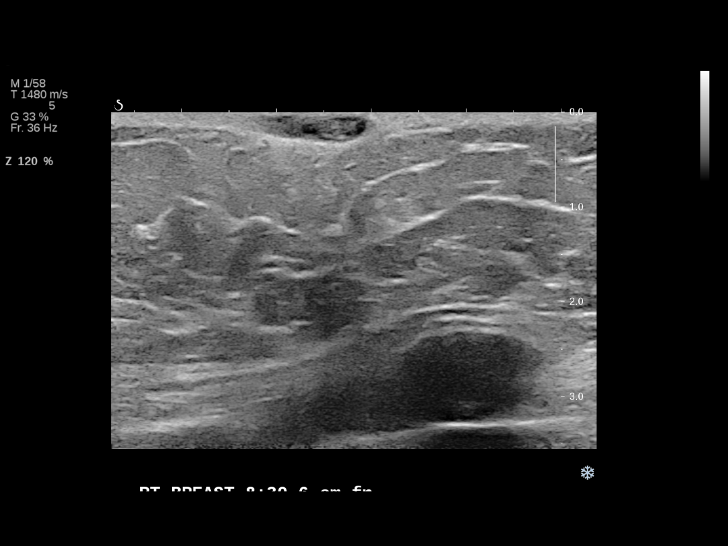
[im 9/11]
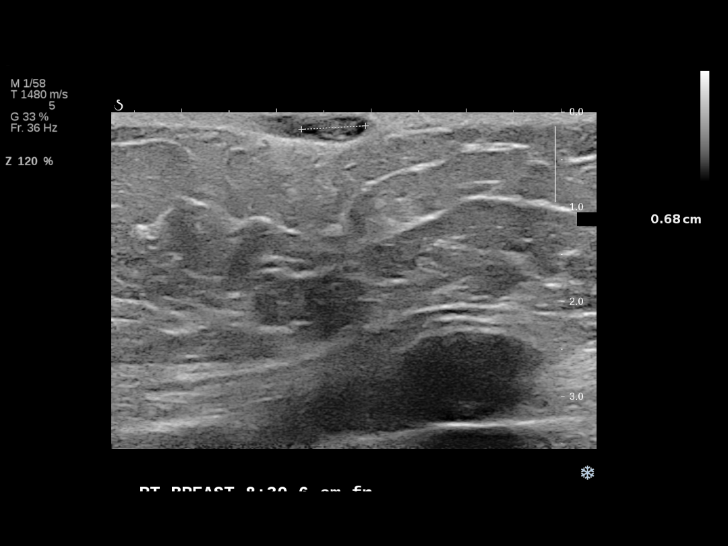
[im 10/11]
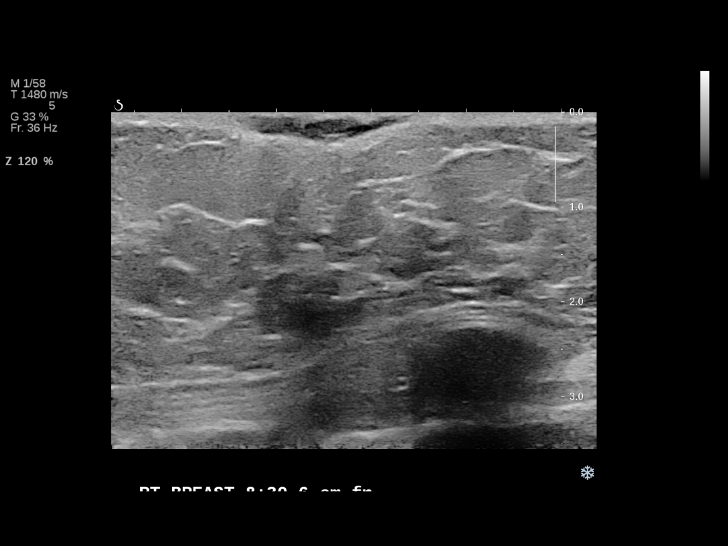
[im 11/11]
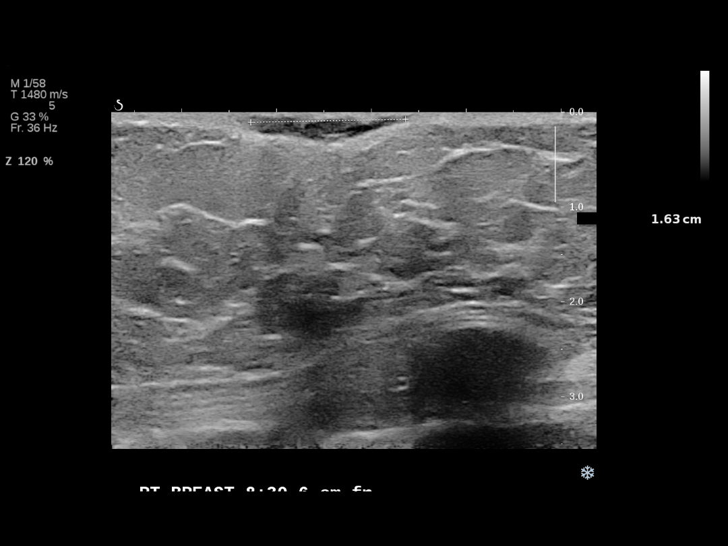

[11 of 11 positions shown; findings below may reference images not displayed]

ACR Breast Density Category b: There are scattered areas of
fibroglandular density.
FINDINGS: The skin lesion marked in the lower inner right breast on 02/19/2015
is again demonstrated and appears somewhat smaller and less dense
today. No other abnormalities are seen.

Mammographic images were processed with CAD.

On physical exam, the patient has an approximately 5 x 3 mm oval,
smoothly marginated, cream colored mass in the skin in the 3:30
o'clock position of the right breast, 6 cm from the nipple. The
ultrasound images are incorrectly labeled the 8:30 o'clock position.
Immediately superior to the mass, an approximately 10 x 7 cm oval,
heterogeneous area of skin redness and brown discoloration is
demonstrated. This corresponds to the location of the recent boil.

Targeted ultrasound is performed, showing an 8 x 7 x 3 mm oval,
horizontally oriented, circumscribed, heterogeneous mass in the skin
in the 3:30 o'clock position of the right breast, 6 cm from the
nipple, corresponding to the palpable, cream colored mass. No
internal blood flow was seen with color Doppler. There is linear
hypoechogenicity extending in the skin from this mass, most
pronounced superiorly and medially, corresponding to the area of
skin discoloration. There is internal blood flow within this area.
IMPRESSION: 8 x 7 x 3 mm probable sebaceous cyst or epidermal inclusion cyst in
the 3:30 o'clock position of the left breast, 6 cm from the nipple
with adjacent residual inflammatory/infectious changes in the skin.
No findings suspicious for malignancy.

RECOMMENDATION:
1. Planned excision of the skin lesion in the 8:30 o'clock position
of the right breast.
2. Bilateral screening mammogram in 3 months. That will be 1 year
since mammographic evaluation of the left breast.

I have discussed the findings and recommendations with the patient.
Results were also provided in writing at the conclusion of the
visit. If applicable, a reminder letter will be sent to the patient
regarding the next appointment.

BI-RADS CATEGORY  2: Benign.

## 2016-05-13 ENCOUNTER — Ambulatory Visit (INDEPENDENT_AMBULATORY_CARE_PROVIDER_SITE_OTHER): Payer: BLUE CROSS/BLUE SHIELD | Admitting: Family Medicine

## 2016-05-13 ENCOUNTER — Ambulatory Visit (INDEPENDENT_AMBULATORY_CARE_PROVIDER_SITE_OTHER): Payer: BLUE CROSS/BLUE SHIELD

## 2016-05-13 VITALS — BP 180/108 | HR 62 | Temp 98.4°F | Resp 12 | Wt 201.0 lb

## 2016-05-13 DIAGNOSIS — R091 Pleurisy: Secondary | ICD-10-CM | POA: Diagnosis not present

## 2016-05-13 MED ORDER — PREDNISONE 10 MG PO TABS: 30.0000 mg | ORAL_TABLET | Freq: Every day | ORAL | Status: DC

## 2016-05-13 MED ORDER — TRAMADOL HCL 50 MG PO TABS: 50.0000 mg | ORAL_TABLET | Freq: Three times a day (TID) | ORAL | Status: DC | PRN

## 2016-05-13 MED ORDER — ONDANSETRON HCL 4 MG PO TABS: 4.0000 mg | ORAL_TABLET | Freq: Three times a day (TID) | ORAL | Status: DC | PRN

## 2016-05-13 NOTE — Assessment & Plan Note (Signed)
Chest pain seems consistent with pleuritis. Chest x-ray is normal. EKG is relatively normal. CBC and CMP pending. Treat with prednisone, tramadol and Zofran. Follow-up with PCP. Discussed warning signs and symptoms.

## 2016-05-13 NOTE — Patient Instructions (Addendum)
Thank you for coming in today. Call or go to the emergency room if you get worse, have trouble breathing, have chest pains, or palpitations.  Get xray and labs. Take tramadol for severe pain.  Use zofran as needed for vomiting.  Return later this week with myself or Dr Ivan Anchors if not better.  Increase prednisone for 5 days.   Pleurisy Pleurisy is an inflammation and swelling of the lining of the lungs (pleura). Because of this inflammation, it hurts to breathe. It can be aggravated by coughing, laughing, or deep breathing. Pleurisy is often caused by an underlying infection or disease.  HOME CARE INSTRUCTIONS  Monitor your pleurisy for any changes. The following actions may help to alleviate any discomfort you are experiencing:  Medicine may help with pain. Only take over-the-counter or prescription medicines for pain, discomfort, or fever as directed by your health care provider.  Only take antibiotic medicine as directed. Make sure to finish it even if you start to feel better. SEEK MEDICAL CARE IF:   Your pain is not controlled with medicine or is increasing.  You have an increase in pus-like (purulent) secretions brought up with coughing. SEEK IMMEDIATE MEDICAL CARE IF:   You have blue or dark lips, fingernails, or toenails.  You are coughing up blood.  You have increased difficulty breathing.  You have continuing pain unrelieved by medicine or pain lasting more than 1 week.  You have pain that radiates into your neck, arms, or jaw.  You develop increased shortness of breath or wheezing.  You develop a fever, rash, vomiting, fainting, or other serious symptoms. MAKE SURE YOU:  Understand these instructions.   Will watch your condition.   Will get help right away if you are not doing well or get worse.    This information is not intended to replace advice given to you by your health care provider. Make sure you discuss any questions you have with your health care  provider.   Document Released: 12/15/2005 Document Revised: 08/17/2013 Document Reviewed: 05/29/2013 Elsevier Interactive Patient Education [employer]

## 2016-05-13 NOTE — Progress Notes (Signed)
Karla Hawkins is a 60 y.o. female who presents to [facility] Karla Hawkins: Primary Care today for right-sided upper chest pain. Patient is a several day history of up right posterior chest pain. She notes the pain is consistent with previous episodes of pleuritis. She took some leftover oxycodone which caused vomiting. She's had several episodes of vomiting without diarrhea. She notes this caused back cramps and pain. She denies any fevers chills or trouble breathing. She notes the chest pain is not exertional. She denies any significant shortness of breath but does note pain with deep inspiration and cough.   Past Medical History  Diagnosis Date  . Rheumatoid arthritis(714.0)   . Hypertension   . Cyst     on upper back  . Hyperlipidemia    Past Surgical History  Procedure Laterality Date  . Cesarean section    . Abdominal hysterectomy  1999    partial  . Appendectomy  1998  . Dilation and curettage of uterus  1986, 1990, 1994, 1998    due to miscarriage  . Mass excision  06/29/2012    Procedure: MINOR EXCISION OF MASS;  Surgeon: Karla Mention, MD;  Location:  SUR[facility]ERY CENTER;  Service: General;  Laterality: N/A;  excise cyst of back   Social History  Substance Use Topics  . Smoking status: Never Smoker   . Smokeless tobacco: Never Used  . Alcohol Use: No   family history includes Heart disease in her father and mother.  ROS as above Medications: Current Outpatient Prescriptions  Medication Sig Dispense Refill  . betamethasone dipropionate (DIPROLENE) 0.05 % ointment Apply topically 2 (two) times daily.    . clindamycin (CLEOCIN) 300 MG capsule     . ENBREL SURECLICK 50 MG/ML injection     . Etanercept (ENBREL ) Inject into the skin.    Marland Kitchen HYDROcodone-acetaminophen (NORCO/VICODIN) 5-325 MG per tablet Take 1 tablet by mouth.    . leflunomide (ARAVA) 20 MG tablet     .  lisinopril-hydrochlorothiazide (PRINZIDE,ZESTORETIC) 20-12.5 MG tablet Take 1 tablet by mouth daily. 90 tablet 2  . Multiple Vitamin (MULTIVITAMIN) tablet Take 1 tablet by mouth daily.    Marland Kitchen omeprazole (PRILOSEC) 40 MG capsule One by mouth daily at least one hour before a meal. 90 capsule 1  . ondansetron (ZOFRAN) 4 MG tablet Take 1-2 tablets (4-8 mg total) by mouth every 8 (eight) hours as needed for nausea or vomiting. 20 tablet 0  . predniSONE (DELTASONE) 10 MG tablet Take 3 tablets (30 mg total) by mouth daily with breakfast. 15 tablet 0  . predniSONE (DELTASONE) 5 MG tablet Take 1 tablet (5 mg total) by mouth daily. 90 tablet 0  . traMADol (ULTRAM) 50 MG tablet Take 1 tablet (50 mg total) by mouth every 8 (eight) hours as needed. 30 tablet 0  . triamcinolone cream (KENALOG) 0.1 % Apply to affected areas twice a day for up to two weeks, avoid face. 80 g 0   No current facility-administered medications for this visit.   Allergies  Allergen Reactions  . Tape Other (See Comments)    Patient is allergic to surgical tape.  It causes blisters.  Karla Hawkins [Karla Hawkins] Nausea And Vomiting  . Tigan [Trimethobenzamide Hcl] Other (See Comments)    Leg cramps     Exam:  BP 180/108 mmHg  Pulse 62  Temp(Src) 98.4 F (36.9 C)  Resp 12  Wt 201 lb (91.173 kg)  SpO2 97%  Gen: Well  NAD Nontoxic appearing HEENT: EOMI,  MMM Lungs: Normal work of breathing. CTABL Heart: RRR no MRG Chest wall is nontender Abd: NABS, Soft. Nondistended, Nontender Exts: Brisk capillary refill, warm and well perfused.   Twelve-lead EKG shows normal sinus rhythm at 62 bpm. No significant ST segment elevation or depression. No Q waves. Possible left atrial enlargement otherwise normal EKG.  No results found for this or any previous visit (from the past 24 hour(s)). Dg Chest 2 View  05/13/2016  CLINICAL DATA:  Cough.  Pleuritis. EXAM: CHEST  2 VIEW COMPARISON:  05/27/2012 chest radiograph. FINDINGS: Stable  cardiomediastinal silhouette with normal heart size. No pneumothorax. No pleural effusion. Lungs appear clear, with no acute consolidative airspace disease and no pulmonary edema. IMPRESSION: No active cardiopulmonary disease. Electronically Signed   By: Karla Hawkins M.D.   On: 05/13/2016 13:44     Please see individual assessment and plan sections.

## 2016-05-13 NOTE — Progress Notes (Signed)
Quick Note:    Chest xray is normal.  ______

## 2016-05-14 LAB — COMPREHENSIVE METABOLIC PANEL
ALK PHOS: 44 U/L (ref 33–130)
ALT: 13 U/L (ref 6–29)
AST: 23 U/L (ref 10–35)
Albumin: 4.5 g/dL (ref 3.6–5.1)
BUN: 14 mg/dL (ref 7–25)
CO2: 24 mmol/L (ref 20–31)
Calcium: 9.8 mg/dL (ref 8.6–10.4)
Chloride: 92 mmol/L — ABNORMAL LOW (ref 98–110)
Creat: 0.69 mg/dL (ref 0.50–1.05)
GLUCOSE: 85 mg/dL (ref 65–99)
POTASSIUM: 4.4 mmol/L (ref 3.5–5.3)
Sodium: 134 mmol/L — ABNORMAL LOW (ref 135–146)
Total Bilirubin: 0.9 mg/dL (ref 0.2–1.2)
Total Protein: 7.4 g/dL (ref 6.1–8.1)

## 2016-05-14 LAB — CBC
HEMATOCRIT: 47.3 % — AB (ref 35.0–45.0)
Hemoglobin: 16.3 g/dL — ABNORMAL HIGH (ref 11.7–15.5)
MCH: 31.3 pg (ref 27.0–33.0)
MCHC: 34.5 g/dL (ref 32.0–36.0)
MCV: 91 fL (ref 80.0–100.0)
MPV: 13.9 fL — AB (ref 7.5–12.5)
PLATELETS: 261 10*3/uL (ref 140–400)
RBC: 5.2 MIL/uL — ABNORMAL HIGH (ref 3.80–5.10)
RDW: 13.1 % (ref 11.0–15.0)
WBC: 11.6 10*3/uL — ABNORMAL HIGH (ref 3.8–10.8)

## 2016-06-19 ENCOUNTER — Other Ambulatory Visit: Payer: Self-pay | Admitting: *Deleted

## 2016-06-19 DIAGNOSIS — I10 Essential (primary) hypertension: Secondary | ICD-10-CM

## 2016-06-19 MED ORDER — LISINOPRIL-HYDROCHLOROTHIAZIDE 20-12.5 MG PO TABS: 1.0000 | ORAL_TABLET | Freq: Every day | ORAL | Status: DC

## 2016-12-11 ENCOUNTER — Ambulatory Visit (INDEPENDENT_AMBULATORY_CARE_PROVIDER_SITE_OTHER): Payer: BLUE CROSS/BLUE SHIELD | Admitting: Osteopathic Medicine

## 2016-12-11 VITALS — BP 131/74 | HR 73 | Temp 97.8°F

## 2016-12-11 DIAGNOSIS — Z23 Encounter for immunization: Secondary | ICD-10-CM

## 2016-12-11 NOTE — Progress Notes (Signed)
Patient came into clinic today for flu vaccination. Patient tolerated injection of flu immunization in right deltoid well, with no immediate complications. Advised to contact our office with any questions/concerns.  

## 2017-01-05 ENCOUNTER — Ambulatory Visit: Payer: BLUE CROSS/BLUE SHIELD | Admitting: Physician Assistant

## 2017-01-07 ENCOUNTER — Encounter: Payer: Self-pay | Admitting: Physician Assistant

## 2017-01-07 ENCOUNTER — Ambulatory Visit (INDEPENDENT_AMBULATORY_CARE_PROVIDER_SITE_OTHER): Payer: BLUE CROSS/BLUE SHIELD | Admitting: Physician Assistant

## 2017-01-07 VITALS — BP 138/88 | HR 89 | Ht 67.0 in | Wt 230.0 lb

## 2017-01-07 DIAGNOSIS — Z1159 Encounter for screening for other viral diseases: Secondary | ICD-10-CM

## 2017-01-07 DIAGNOSIS — E7800 Pure hypercholesterolemia, unspecified: Secondary | ICD-10-CM

## 2017-01-07 DIAGNOSIS — Z0000 Encounter for general adult medical examination without abnormal findings: Secondary | ICD-10-CM

## 2017-01-07 DIAGNOSIS — I714 Abdominal aortic aneurysm, without rupture, unspecified: Secondary | ICD-10-CM

## 2017-01-07 DIAGNOSIS — Z131 Encounter for screening for diabetes mellitus: Secondary | ICD-10-CM

## 2017-01-07 DIAGNOSIS — Z1231 Encounter for screening mammogram for malignant neoplasm of breast: Secondary | ICD-10-CM

## 2017-01-07 DIAGNOSIS — Z13820 Encounter for screening for osteoporosis: Secondary | ICD-10-CM

## 2017-01-07 DIAGNOSIS — M069 Rheumatoid arthritis, unspecified: Secondary | ICD-10-CM | POA: Diagnosis not present

## 2017-01-07 DIAGNOSIS — I10 Essential (primary) hypertension: Secondary | ICD-10-CM

## 2017-01-07 DIAGNOSIS — Z7952 Long term (current) use of systemic steroids: Secondary | ICD-10-CM

## 2017-01-07 MED ORDER — LISINOPRIL-HYDROCHLOROTHIAZIDE 20-12.5 MG PO TABS: 1.0000 | ORAL_TABLET | Freq: Every day | ORAL | 2 refills | Status: DC

## 2017-01-07 NOTE — Progress Notes (Deleted)
   Subjective:    Patient ID: Karla Hawkins, female    DOB: 1956-09-14, 61 y.o.   MRN: [id number]  HPI  rhuematologist Mammogram 2013 dexa normal  Eye doctor. Review of Systems     Objective:   Physical Exam        Assessment & Plan:

## 2017-01-07 NOTE — Patient Instructions (Addendum)
D3 1000 units daily with calcium(4 servings dairy)1500mg  of calcium caltrate-D   Keeping You Healthy  Get These Tests  Blood Pressure- Have your blood pressure checked by your healthcare provider at least once a year.  Normal blood pressure is 120/80.  Weight- Have your body mass index (BMI) calculated to screen for obesity.  BMI is a measure of body fat based on height and weight.  You can calculate your own BMI at https://www.west-esparza.com/  Cholesterol- Have your cholesterol checked every year.  Diabetes- Have your blood sugar checked every year if you have high blood pressure, high cholesterol, a family history of diabetes or if you are overweight.  Pap Test - Have a pap test every 1 to 5 years if you have been sexually active.  If you are older than 65 and recent pap tests have been normal you may not need additional pap tests.  In addition, if you have had a hysterectomy  for benign disease additional pap tests are not necessary.  Mammogram-Yearly mammograms are essential for early detection of breast cancer  Screening for Colon Cancer- Colonoscopy starting at age 100. Screening may begin sooner depending on your family history and other health conditions.  Follow up colonoscopy as directed by your Gastroenterologist.  Screening for Osteoporosis- Screening begins at age 73 with bone density scanning, sooner if you are at higher risk for developing Osteoporosis.  Get these medicines  Calcium with Vitamin D- Your body requires 1200-1500 mg of Calcium a day and [phone] IU of Vitamin D a day.  You can only absorb 500 mg of Calcium at a time therefore Calcium must be taken in 2 or 3 separate doses throughout the day.  Hormones- Hormone therapy has been associated with increased risk for certain cancers and heart disease.  Talk to your healthcare provider about if you need relief from menopausal symptoms.  Aspirin- Ask your healthcare provider about taking Aspirin to prevent Heart  Disease and Stroke.  Get these Immuniztions  Flu shot- Every fall  Pneumonia shot- Once after the age of 61; if you are younger ask your healthcare provider if you need a pneumonia shot.  Tetanus- Every ten years.  Zostavax- Once after the age of 51 to prevent shingles.  Take these steps  Don't smoke- Your healthcare provider can help you quit. For tips on how to quit, ask your healthcare provider or go to www.smokefree.gov or call 1-800 QUIT-NOW.  Be physically active- Exercise 5 days a week for a minimum of 30 minutes.  If you are not already physically active, start slow and gradually work up to 30 minutes of moderate physical activity.  Try walking, dancing, bike riding, swimming, etc.  Eat a healthy diet- Eat a variety of healthy foods such as fruits, vegetables, whole grains, low fat milk, low fat cheeses, yogurt, lean meats, chicken, fish, eggs, dried beans, tofu, etc.  For more information go to www.thenutritionsource.org  Dental visit- Brush and floss teeth twice daily; visit your dentist twice a year.  Eye exam- Visit your Optometrist or Ophthalmologist yearly.  Drink alcohol in moderation- Limit alcohol intake to one drink or less a day.  Never drink and drive.  Depression- Your emotional health is as important as your physical health.  If you're feeling down or losing interest in things you normally enjoy, please talk to your healthcare provider.  Seat Belts- can save your life; always wear one  Smoke/Carbon Monoxide detectors- These detectors need to be installed on the appropriate level of  your home.  Replace batteries at least once a year.  Violence- If anyone is threatening or hurting you, please tell your healthcare provider.  Living Will/ Health care power of attorney- Discuss with your healthcare provider and family.

## 2017-01-09 NOTE — Progress Notes (Signed)
Subjective:     Karla Hawkins is a 61 y.o. female and is here for a comprehensive physical exam. The patient reports no problems.  Social History   Social History  . Marital status: Married    Spouse name: N/A  . Number of children: N/A  . Years of education: N/A   Occupational History  . Not on file.   Social History Main Topics  . Smoking status: Never Smoker  . Smokeless tobacco: Never Used  . Alcohol use No  . Drug use: No  . Sexual activity: No   Other Topics Concern  . Not on file   Social History Narrative  . No narrative on file   Health Maintenance  Topic Date Due  . Hepatitis C Screening  05-Aug-1956  . DEXA SCAN  01/13/2014  . PAP SMEAR  01/07/2018 (Originally 07/25/1977)  . ZOSTAVAX  01/07/2018 (Originally 07/25/2016)  . HIV Screening  01/07/2018 (Originally 07/26/1971)  . MAMMOGRAM  02/19/2017  . COLONOSCOPY  12/07/2017  . TETANUS/TDAP  10/29/2020  . INFLUENZA VACCINE  Completed    The following portions of the patient's history were reviewed and updated as appropriate: allergies, current medications, past family history, past medical history, past social history, past surgical history and problem list.  Review of Systems A comprehensive review of systems was negative.   Objective:    BP 138/88   Pulse 89   Ht 5\' 7"  (1.702 m)   Wt 230 lb (104.3 kg)   BMI 36.02 kg/m  General appearance: alert, cooperative and appears stated age Head: Normocephalic, without obvious abnormality, atraumatic Eyes: conjunctivae/corneas clear. PERRL, EOM's intact. Fundi benign. Ears: normal TM's and external ear canals both ears Nose: Nares normal. Septum midline. Mucosa normal. No drainage or sinus tenderness. Throat: lips, mucosa, and tongue normal; teeth and gums normal Neck: no adenopathy, no carotid bruit, no JVD, supple, symmetrical, trachea midline and thyroid not enlarged, symmetric, no tenderness/mass/nodules Back: symmetric, no curvature. ROM normal. No CVA  tenderness. Lungs: clear to auscultation bilaterally Heart: regular rate and rhythm, S1, S2 normal, no murmur, click, rub or gallop Abdomen: soft, non-tender; bowel sounds normal; no masses,  no organomegaly Extremities: extremities normal, atraumatic, no cyanosis or edema Pulses: 2+ and symmetric Skin: Skin color, texture, turgor normal. No rashes or lesions Lymph nodes: Cervical, supraclavicular, and axillary nodes normal. Neurologic: Alert and oriented X 3, normal strength and tone. Normal symmetric reflexes. Normal coordination and gait    Assessment:    Healthy female exam.      Plan:    Marland KitchenDia was seen today for gynecologic exam.  Diagnoses and all orders for this visit:  Routine physical examination -     Lipid panel -     Hepatitis C Antibody -     COMPLETE METABOLIC PANEL WITH GFR  Rheumatoid arthritis, involving unspecified site, unspecified rheumatoid factor presence (HCC)  Abdominal aortic aneurysm (AAA) without rupture (HCC)  Pure hypercholesterolemia -     Lipid panel  Need for hepatitis C screening test -     Hepatitis C Antibody  Screening for diabetes mellitus -     COMPLETE METABOLIC PANEL WITH GFR  Essential hypertension -     lisinopril-hydrochlorothiazide (PRINZIDE,ZESTORETIC) 20-12.5 MG tablet; Take 1 tablet by mouth daily.  Long term systemic steroid user -     DG Bone Density; Future -     DG Bone Density  Osteoporosis screening -     DG Bone Density; Future -  DG Bone Density  Visit for screening mammogram -     MM DIGITAL SCREENING BILATERAL; Future -     MM DIGITAL SCREENING BILATERAL   Discussed vitamin D 1000 units and calcium 1500mg  or 4 servings of dairy.  Encouraged regular exercise at least 150 minutes a week.  See After Visit Summary for Counseling Recommendations

## 2017-01-19 LAB — COMPLETE METABOLIC PANEL WITH GFR
ALBUMIN: 4.1 g/dL (ref 3.6–5.1)
ALK PHOS: 39 U/L (ref 33–130)
ALT: 17 U/L (ref 6–29)
AST: 23 U/L (ref 10–35)
BILIRUBIN TOTAL: 0.9 mg/dL (ref 0.2–1.2)
BUN: 13 mg/dL (ref 7–25)
CALCIUM: 9.8 mg/dL (ref 8.6–10.4)
CO2: 24 mmol/L (ref 20–31)
CREATININE: 0.62 mg/dL (ref 0.50–0.99)
Chloride: 106 mmol/L (ref 98–110)
GFR, Est African American: >89 mL/min (ref >=60)
GFR, Est Non African American: >89 mL/min (ref >=60)
Glucose, Bld: 95 mg/dL (ref 65–99)
Potassium: 4 mmol/L (ref 3.5–5.3)
Sodium: 142 mmol/L (ref 135–146)
TOTAL PROTEIN: 6.8 g/dL (ref 6.1–8.1)

## 2017-01-19 LAB — LIPID PANEL
CHOLESTEROL: 231 mg/dL — AB (ref <200)
HDL: 61 mg/dL (ref >50)
LDL Cholesterol: 152 mg/dL — ABNORMAL HIGH (ref <100)
TRIGLYCERIDES: 91 mg/dL (ref <150)
Total CHOL/HDL Ratio: 3.8 Ratio (ref <5.0)
VLDL: 18 mg/dL (ref <30)

## 2017-01-19 LAB — HEPATITIS C ANTIBODY: HCV Ab: NEGATIVE

## 2017-01-20 NOTE — Progress Notes (Signed)
Call pt: hep C negative.  Kidney, liver, glucose look great.  HDL great.  TG look great.  LDL has elevated some more. CV risk is 5.4 percent over next 10 years. Means not recommended a statin but do need to make lifestyle changes(low fat) to decrease this. Consider red yeast rice 1200mg  bid.

## 2017-01-23 ENCOUNTER — Ambulatory Visit (INDEPENDENT_AMBULATORY_CARE_PROVIDER_SITE_OTHER): Payer: BLUE CROSS/BLUE SHIELD

## 2017-01-23 DIAGNOSIS — Z7952 Long term (current) use of systemic steroids: Secondary | ICD-10-CM | POA: Diagnosis not present

## 2017-01-23 DIAGNOSIS — Z1231 Encounter for screening mammogram for malignant neoplasm of breast: Secondary | ICD-10-CM

## 2017-01-23 DIAGNOSIS — Z13820 Encounter for screening for osteoporosis: Secondary | ICD-10-CM | POA: Diagnosis not present

## 2017-07-21 LAB — CBC AND DIFFERENTIAL
HCT: 42 (ref 36–46)
HEMOGLOBIN: 13.6 (ref 12.0–16.0)

## 2017-07-21 LAB — HEPATIC FUNCTION PANEL
ALT: 16 (ref 7–35)
AST: 30 (ref 13–35)

## 2017-07-21 LAB — BASIC METABOLIC PANEL: CREATININE: 0.7 (ref 0.5–1.1)

## 2017-07-31 ENCOUNTER — Encounter: Payer: Self-pay | Admitting: Physician Assistant

## 2017-09-02 ENCOUNTER — Other Ambulatory Visit: Payer: Self-pay | Admitting: Physician Assistant

## 2017-09-02 DIAGNOSIS — I10 Essential (primary) hypertension: Secondary | ICD-10-CM

## 2018-01-18 ENCOUNTER — Other Ambulatory Visit: Payer: Self-pay | Admitting: Family Medicine

## 2018-01-18 DIAGNOSIS — Z1231 Encounter for screening mammogram for malignant neoplasm of breast: Secondary | ICD-10-CM

## 2018-02-15 ENCOUNTER — Ambulatory Visit: Payer: BLUE CROSS/BLUE SHIELD

## 2018-03-08 ENCOUNTER — Ambulatory Visit
Admission: RE | Admit: 2018-03-08 | Discharge: 2018-03-08 | Disposition: A | Discharge status: Home / Self Care | Payer: BLUE CROSS/BLUE SHIELD | Source: Ambulatory Visit | Attending: Family Medicine | Admitting: Family Medicine

## 2018-03-08 DIAGNOSIS — Z1231 Encounter for screening mammogram for malignant neoplasm of breast: Secondary | ICD-10-CM

## 2019-10-17 ENCOUNTER — Other Ambulatory Visit: Payer: Self-pay | Admitting: Family Medicine

## 2019-10-17 DIAGNOSIS — Z1231 Encounter for screening mammogram for malignant neoplasm of breast: Secondary | ICD-10-CM

## 2019-12-05 ENCOUNTER — Ambulatory Visit: Payer: BLUE CROSS/BLUE SHIELD

## 2019-12-12 ENCOUNTER — Other Ambulatory Visit: Payer: Self-pay

## 2019-12-12 ENCOUNTER — Ambulatory Visit
Admission: RE | Admit: 2019-12-12 | Discharge: 2019-12-12 | Disposition: A | Discharge status: Home / Self Care | Payer: BLUE CROSS/BLUE SHIELD | Source: Ambulatory Visit | Attending: Family Medicine | Admitting: Family Medicine

## 2019-12-12 ENCOUNTER — Ambulatory Visit: Payer: BLUE CROSS/BLUE SHIELD

## 2019-12-12 DIAGNOSIS — Z1231 Encounter for screening mammogram for malignant neoplasm of breast: Secondary | ICD-10-CM

## 2020-04-14 IMAGING — MG DIGITAL SCREENING BILAT W/ CAD
4 series · 4 of 4 positions shown · non-contrast
Comparison: Previous exam(s).

ACR Breast Density Category a: The breast tissue is almost entirely
fatty.

CLINICAL DATA: Screening.

EXAM:
DIGITAL SCREENING BILATERAL MAMMOGRAM WITH CAD

[L CC]
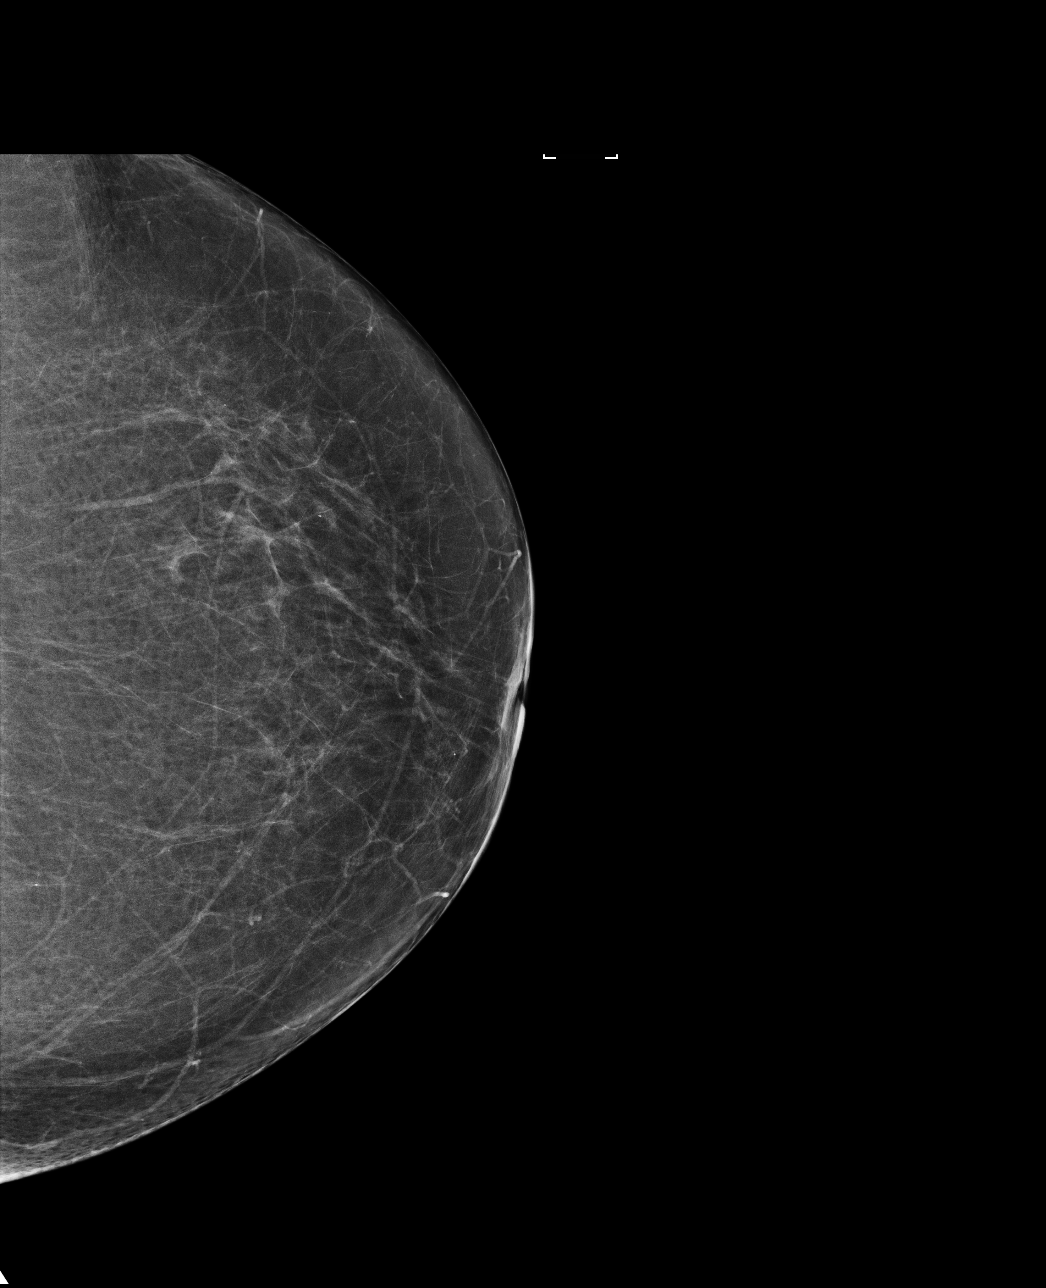

[L MLO]
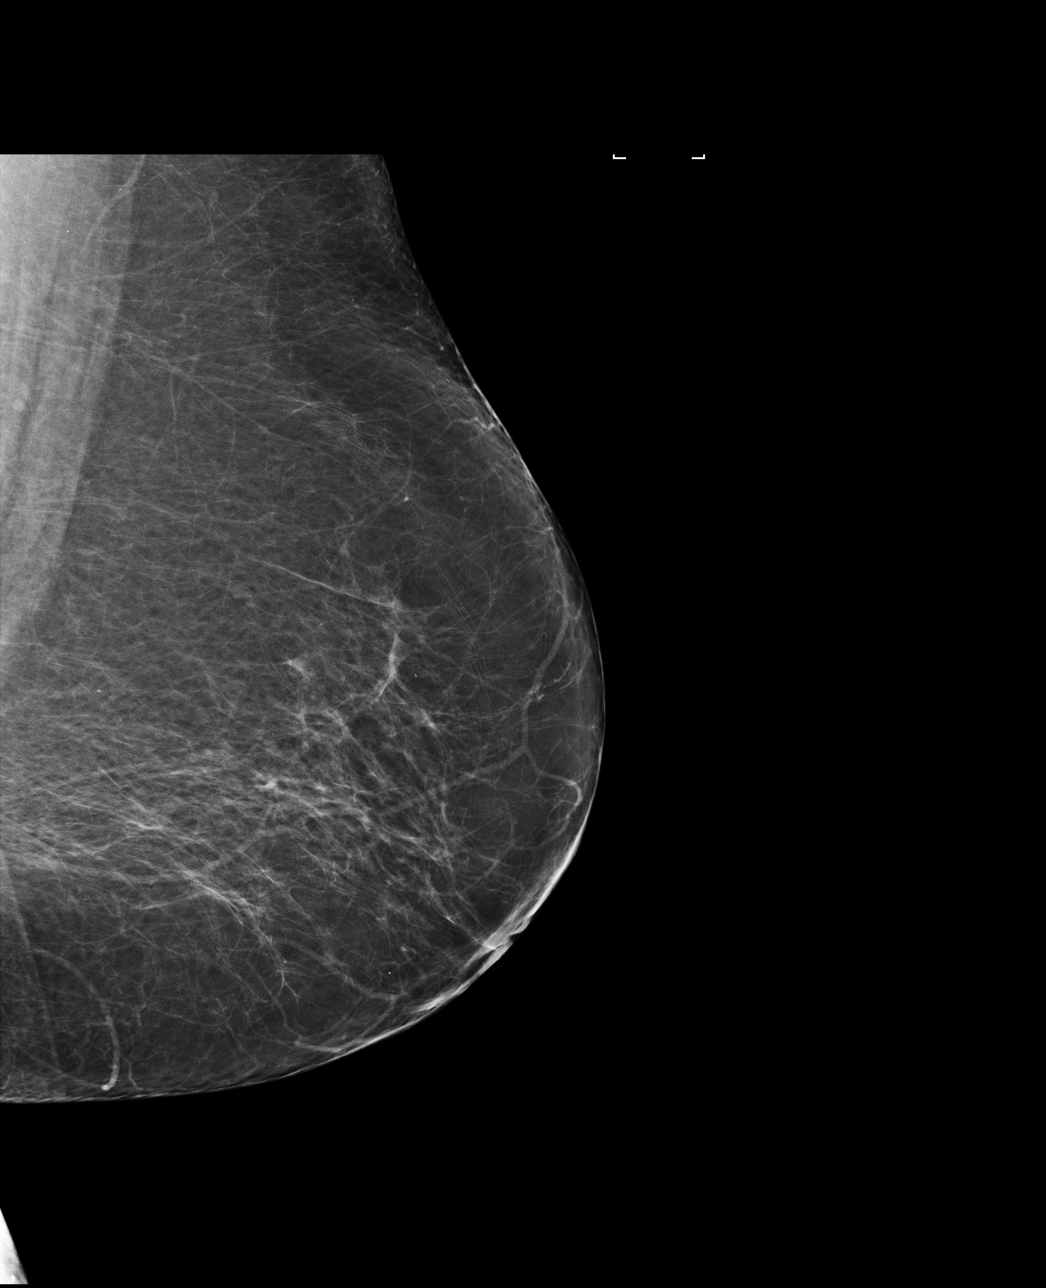

[R CC]
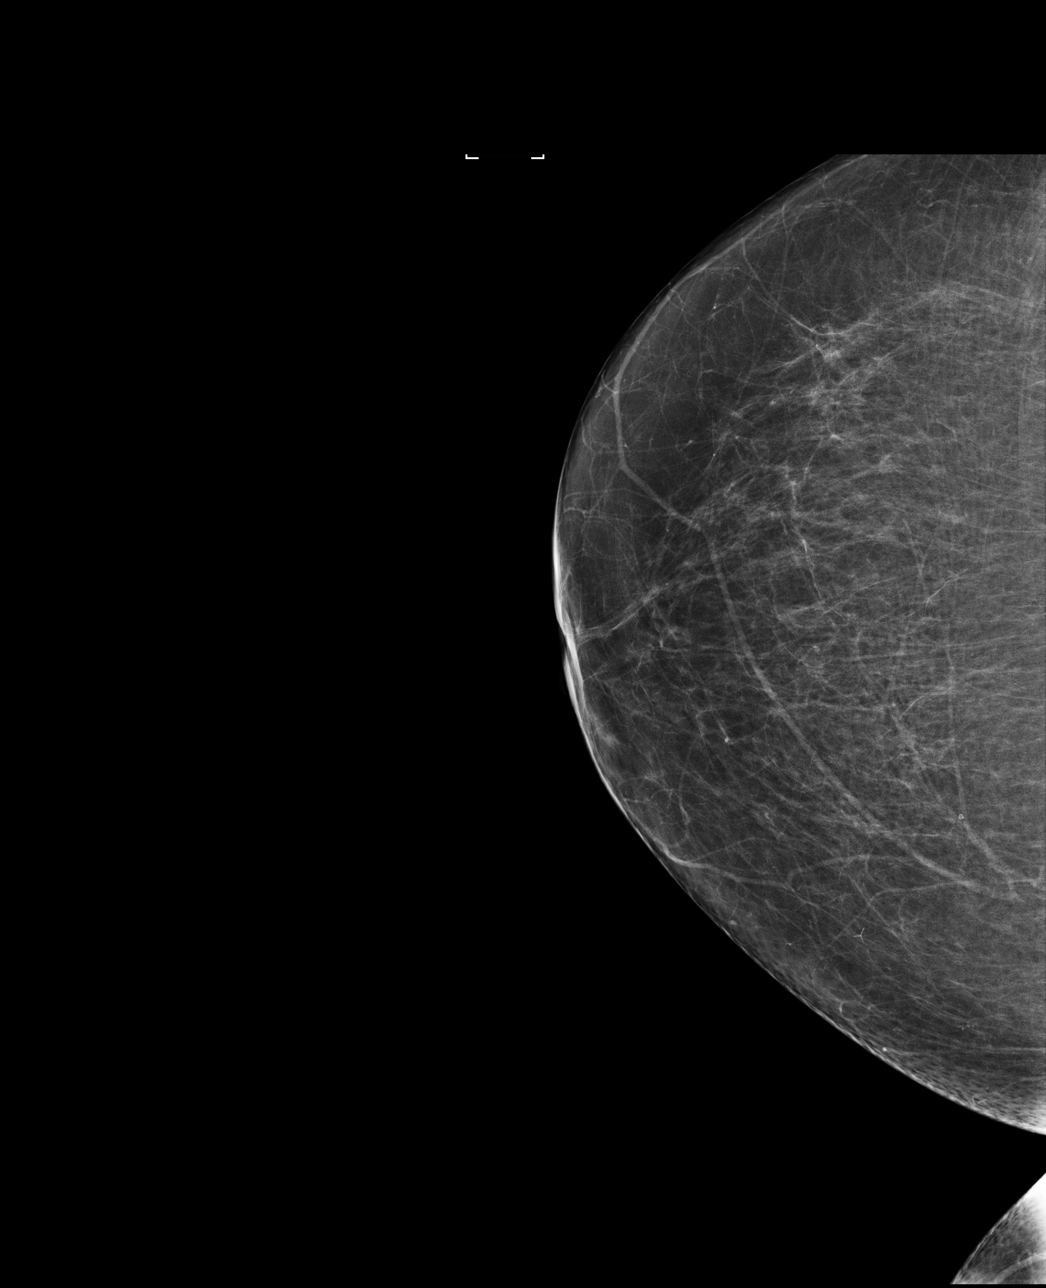

[R MLO]
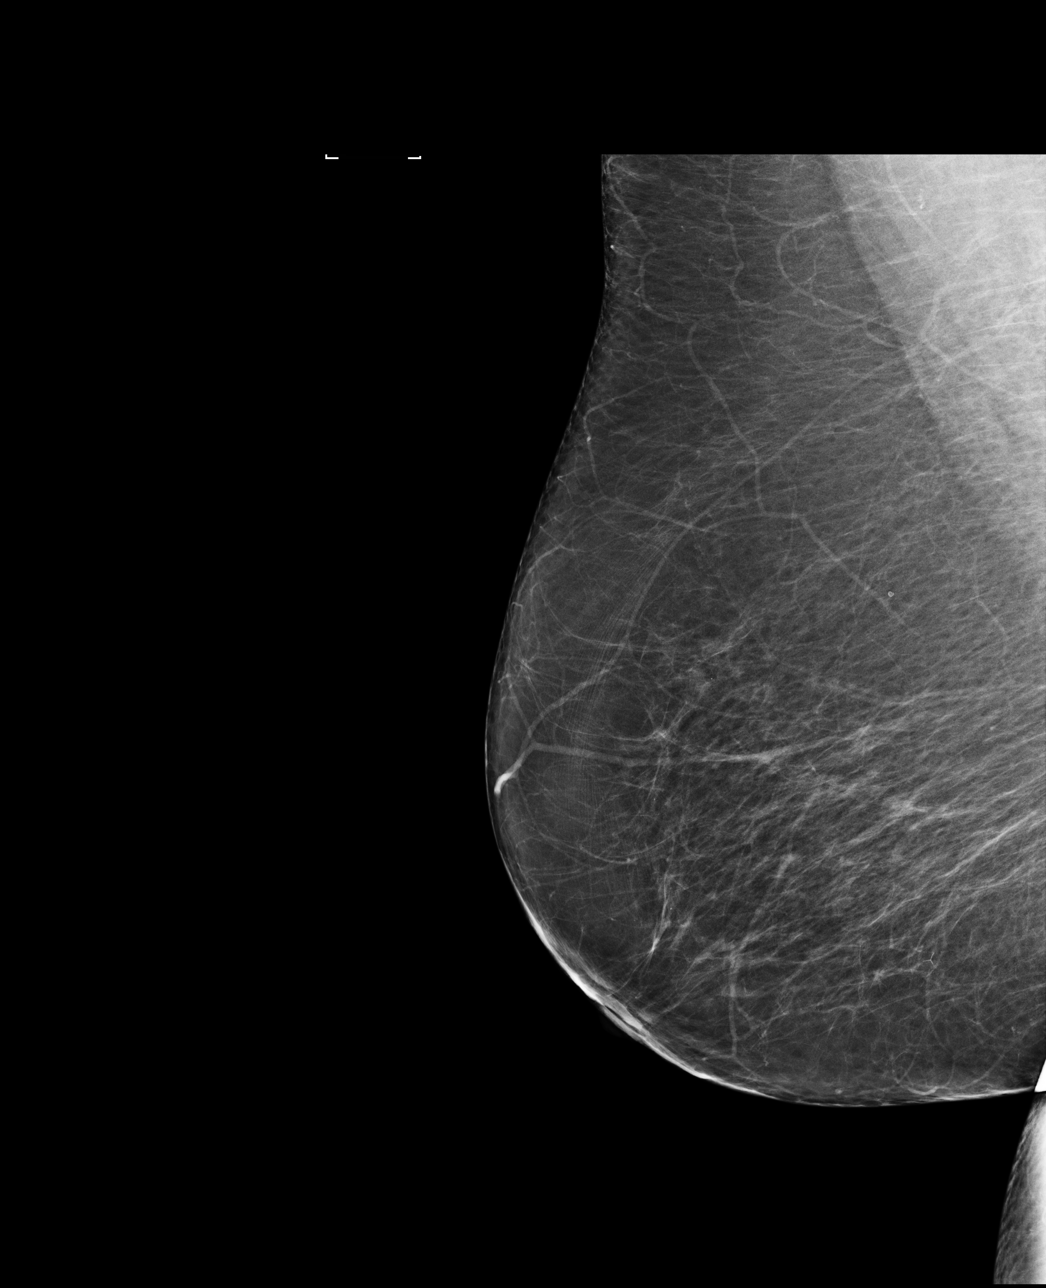

[4 of 4 positions shown; findings below may reference images not displayed]

FINDINGS: There are no findings suspicious for malignancy. Images were
processed with CAD.
IMPRESSION: No mammographic evidence of malignancy. A result letter of this
screening mammogram will be mailed directly to the patient.

RECOMMENDATION:
Screening mammogram in one year. (Code:[id number])

BI-RADS CATEGORY  1: Negative.
